# Patient Record
Sex: Female | Born: 1965 | Race: Black or African American | Hispanic: No | Marital: Married | Smoking: Current every day smoker
Health system: Southern US, Community
[De-identification: ages and names within clinical notes are randomized; demographics above are authoritative.]

## PROBLEM LIST (undated history)

## (undated) DIAGNOSIS — Z21 Asymptomatic human immunodeficiency virus [HIV] infection status: Secondary | ICD-10-CM

## (undated) DIAGNOSIS — B20 Human immunodeficiency virus [HIV] disease: Secondary | ICD-10-CM

## (undated) DIAGNOSIS — IMO0002 Reserved for concepts with insufficient information to code with codable children: Secondary | ICD-10-CM

## (undated) HISTORY — DX: Human immunodeficiency virus (HIV) disease: B20

## (undated) HISTORY — DX: Reserved for concepts with insufficient information to code with codable children: IMO0002

## (undated) HISTORY — DX: Asymptomatic human immunodeficiency virus (hiv) infection status: Z21

---

## 2005-08-07 ENCOUNTER — Ambulatory Visit: Payer: Self-pay | Admitting: Internal Medicine

## 2005-08-08 ENCOUNTER — Ambulatory Visit: Payer: Self-pay | Admitting: *Deleted

## 2005-08-14 ENCOUNTER — Ambulatory Visit (HOSPITAL_COMMUNITY): Admission: RE | Admit: 2005-08-14 | Discharge: 2005-08-14 | Payer: Self-pay | Admitting: Family Medicine

## 2006-01-22 ENCOUNTER — Ambulatory Visit: Payer: Self-pay | Admitting: Internal Medicine

## 2006-09-28 DIAGNOSIS — A539 Syphilis, unspecified: Secondary | ICD-10-CM

## 2006-09-28 DIAGNOSIS — Z8619 Personal history of other infectious and parasitic diseases: Secondary | ICD-10-CM

## 2006-09-28 DIAGNOSIS — B1710 Acute hepatitis C without hepatic coma: Secondary | ICD-10-CM

## 2006-09-28 DIAGNOSIS — F1010 Alcohol abuse, uncomplicated: Secondary | ICD-10-CM

## 2006-09-28 DIAGNOSIS — B20 Human immunodeficiency virus [HIV] disease: Secondary | ICD-10-CM | POA: Insufficient documentation

## 2006-10-15 ENCOUNTER — Ambulatory Visit: Payer: Self-pay | Admitting: Internal Medicine

## 2006-10-15 LAB — CONVERTED CEMR LAB
ALT: 9 units/L (ref 0–35)
Albumin: 3.9 g/dL (ref 3.5–5.2)
Alkaline Phosphatase: 74 units/L (ref 39–117)
BUN: 13 mg/dL (ref 6–23)
Basophils Relative: 1 % (ref 0–1)
CO2: 23 meq/L (ref 19–32)
Creatinine, Ser: 0.91 mg/dL (ref 0.40–1.20)
Glucose, Bld: 103 mg/dL — ABNORMAL HIGH (ref 70–99)
HCT: 35.6 % — ABNORMAL LOW (ref 36.0–46.0)
HIV 1 RNA Quant: 57900 copies/mL — ABNORMAL HIGH (ref <50)
Hemoglobin: 11.7 g/dL — ABNORMAL LOW (ref 12.0–15.0)
Lymphs Abs: 1.6 10*3/uL (ref 0.7–3.3)
MCHC: 32.9 g/dL (ref 30.0–36.0)
Monocytes Absolute: 0.4 10*3/uL (ref 0.2–0.7)
Neutro Abs: 2.1 10*3/uL (ref 1.7–7.7)
Neutrophils Relative %: 50 % (ref 43–77)
RBC: 4 M/uL (ref 3.87–5.11)

## 2006-10-29 ENCOUNTER — Ambulatory Visit: Payer: Self-pay | Admitting: Internal Medicine

## 2006-12-17 ENCOUNTER — Ambulatory Visit: Payer: Self-pay | Admitting: Internal Medicine

## 2008-06-12 ENCOUNTER — Encounter: Payer: Self-pay | Admitting: Internal Medicine

## 2008-06-12 ENCOUNTER — Ambulatory Visit: Payer: Self-pay | Admitting: Internal Medicine

## 2008-06-13 ENCOUNTER — Encounter: Payer: Self-pay | Admitting: Internal Medicine

## 2008-06-13 LAB — CONVERTED CEMR LAB
AST: 32 units/L (ref 0–37)
Alkaline Phosphatase: 80 units/L (ref 39–117)
Basophils Absolute: 0 10*3/uL (ref 0.0–0.1)
Basophils Relative: 1 % (ref 0–1)
CD4 T Helper %: 15 % — ABNORMAL LOW (ref 32–62)
Calcium: 9.5 mg/dL (ref 8.4–10.5)
Chlamydia, Swab/Urine, PCR: NEGATIVE
Creatinine, Ser: 0.79 mg/dL (ref 0.40–1.20)
Eosinophils Absolute: 0.1 10*3/uL (ref 0.0–0.7)
Eosinophils Relative: 2 % (ref 0–5)
HCT: 36.1 % (ref 36.0–46.0)
Hemoglobin: 11.7 g/dL — ABNORMAL LOW (ref 12.0–15.0)
Lymphs Abs: 1.2 10*3/uL (ref 0.7–4.0)
MCHC: 32.4 g/dL (ref 30.0–36.0)
Monocytes Absolute: 0.7 10*3/uL (ref 0.1–1.0)
Monocytes Relative: 17 % — ABNORMAL HIGH (ref 3–12)
Neutro Abs: 2.1 10*3/uL (ref 1.7–7.7)
Neutrophils Relative %: 52 % (ref 43–77)
Platelets: 167 10*3/uL (ref 150–400)
RBC: 4.06 M/uL (ref 3.87–5.11)
Total Bilirubin: 0.6 mg/dL (ref 0.3–1.2)
Total Protein: 9 g/dL — ABNORMAL HIGH (ref 6.0–8.3)
Total lymphocyte count: 1200 cells/mcL (ref 700–3300)
WBC: 4 10*3/uL (ref 4.0–10.5)

## 2008-06-15 ENCOUNTER — Encounter: Payer: Self-pay | Admitting: Internal Medicine

## 2008-06-19 ENCOUNTER — Ambulatory Visit (HOSPITAL_COMMUNITY): Admission: RE | Admit: 2008-06-19 | Discharge: 2008-06-19 | Payer: Self-pay | Admitting: Internal Medicine

## 2008-06-26 ENCOUNTER — Encounter: Payer: Self-pay | Admitting: Internal Medicine

## 2008-06-26 ENCOUNTER — Ambulatory Visit: Payer: Self-pay | Admitting: Internal Medicine

## 2008-08-24 ENCOUNTER — Encounter: Payer: Self-pay | Admitting: Internal Medicine

## 2008-08-24 ENCOUNTER — Ambulatory Visit: Payer: Self-pay | Admitting: Internal Medicine

## 2008-08-24 LAB — CONVERTED CEMR LAB
ALT: <8 units/L (ref 0–35)
BUN: 14 mg/dL (ref 6–23)
Basophils Absolute: 0 10*3/uL (ref 0.0–0.1)
Basophils Relative: 1 % (ref 0–1)
CO2: 23 meq/L (ref 19–32)
Calcium: 9.4 mg/dL (ref 8.4–10.5)
Creatinine, Ser: 0.89 mg/dL (ref 0.40–1.20)
Glucose, Bld: 89 mg/dL (ref 70–99)
HCT: 35 % — ABNORMAL LOW (ref 36.0–46.0)
HIV 1 RNA Quant: 3880 copies/mL — ABNORMAL HIGH (ref <48)
Lymphocytes Relative: 50 % — ABNORMAL HIGH (ref 12–46)
MCV: 88.6 fL (ref 78.0–100.0)
Potassium: 4.3 meq/L (ref 3.5–5.3)
Total Bilirubin: 0.4 mg/dL (ref 0.3–1.2)
Total lymphocyte count: 2300 cells/mcL (ref 700–3300)
WBC: 4.6 10*3/uL (ref 4.0–10.5)

## 2009-09-25 ENCOUNTER — Encounter: Payer: Self-pay | Admitting: Infectious Disease

## 2009-09-27 ENCOUNTER — Encounter: Payer: Self-pay | Admitting: Infectious Disease

## 2009-09-27 ENCOUNTER — Ambulatory Visit: Payer: Self-pay | Admitting: Internal Medicine

## 2009-09-27 LAB — CONVERTED CEMR LAB
ALT: 13 units/L (ref 0–35)
Albumin: 4.1 g/dL (ref 3.5–5.2)
Alkaline Phosphatase: 68 units/L (ref 39–117)
BUN: 9 mg/dL (ref 6–23)
CO2: 22 meq/L (ref 19–32)
Calcium: 8.8 mg/dL (ref 8.4–10.5)
Chloride: 107 meq/L (ref 96–112)
Creatinine, Ser: 0.72 mg/dL (ref 0.40–1.20)
Glucose, Bld: 81 mg/dL (ref 70–99)
HCT: 36.5 % (ref 36.0–46.0)
Lymphs Abs: 1.5 10*3/uL (ref 0.7–4.0)
MCHC: 32.6 g/dL (ref 30.0–36.0)
Monocytes Absolute: 0.4 10*3/uL (ref 0.1–1.0)
Neutro Abs: 1.7 10*3/uL (ref 1.7–7.7)
RDW: 13.9 % (ref 11.5–15.5)
Sodium: 139 meq/L (ref 135–145)
Total Protein: 8.1 g/dL (ref 6.0–8.3)
WBC: 3.7 10*3/uL — ABNORMAL LOW (ref 4.0–10.5)

## 2009-11-09 ENCOUNTER — Encounter (INDEPENDENT_AMBULATORY_CARE_PROVIDER_SITE_OTHER): Payer: Self-pay | Admitting: *Deleted

## 2009-11-15 ENCOUNTER — Encounter (INDEPENDENT_AMBULATORY_CARE_PROVIDER_SITE_OTHER): Payer: Self-pay | Admitting: *Deleted

## 2009-11-21 ENCOUNTER — Ambulatory Visit: Payer: Self-pay | Admitting: Internal Medicine

## 2009-11-21 ENCOUNTER — Encounter (INDEPENDENT_AMBULATORY_CARE_PROVIDER_SITE_OTHER): Payer: Self-pay | Admitting: *Deleted

## 2009-11-21 LAB — CONVERTED CEMR LAB: HIV-1 RNA Quant, Log: 5.31 — ABNORMAL HIGH (ref <1.30)

## 2009-11-29 ENCOUNTER — Ambulatory Visit: Payer: Self-pay | Admitting: Internal Medicine

## 2009-11-30 ENCOUNTER — Encounter (INDEPENDENT_AMBULATORY_CARE_PROVIDER_SITE_OTHER): Payer: Self-pay | Admitting: *Deleted

## 2009-12-31 ENCOUNTER — Encounter (INDEPENDENT_AMBULATORY_CARE_PROVIDER_SITE_OTHER): Payer: Self-pay | Admitting: *Deleted

## 2010-01-14 ENCOUNTER — Telehealth (INDEPENDENT_AMBULATORY_CARE_PROVIDER_SITE_OTHER): Payer: Self-pay | Admitting: *Deleted

## 2010-02-05 ENCOUNTER — Encounter (INDEPENDENT_AMBULATORY_CARE_PROVIDER_SITE_OTHER): Payer: Self-pay | Admitting: *Deleted

## 2010-02-08 ENCOUNTER — Encounter: Payer: Self-pay | Admitting: Internal Medicine

## 2010-02-26 ENCOUNTER — Telehealth: Payer: Self-pay | Admitting: *Deleted

## 2010-03-06 NOTE — Miscellaneous (Signed)
Summary: Bridge Counselor  Clinical Lists Changes BC transported pt to [facility] today for her scheduled appointment with Dr. Philipp Deputy. Pt was put on a new regimen of Truvada and Isentress. BC will assist pt with applying for ADAP to obtain her medications. Once pt receives her medications, BC will provide treatment adherence and coordinate scheduling pt's follow up appointments for labs and MD visit.

## 2010-03-06 NOTE — Miscellaneous (Signed)
Summary: RW Financial Update  Clinical Lists Changes  Observations: Added new observation of AIDSDAP: Yes 2011 (12/31/2009 10:30) Added new observation of PCTFPL: 18.08  (12/31/2009 10:30) Added new observation of INCOMESOURCE: wages  (12/31/2009 10:30) Added new observation of HOUSEINCOME: 1960  (12/31/2009 10:30) Added new observation of HOUSING: Stable/permanent  (12/31/2009 10:30) Added new observation of FINASSESSDT: 12/04/2009  (12/31/2009 10:30) Added new observation of YEARLYEXPEN: 0  (12/31/2009 10:30)

## 2010-03-06 NOTE — Miscellaneous (Signed)
Summary: Orders Update  Clinical Lists Changes  Orders: Added new Test order of T-Comprehensive Metabolic Panel [phone]) - Signed Added new Test order of T-CBC w/Diff [phone]) - Signed Added new Test order of T-CD4 [phone]) - Signed Added new Test order of T-HIV Viral Load [phone]) - Signed Added new Test order of T-Syphilis Test ([facility]) ([id number]) - Signed

## 2010-03-06 NOTE — Miscellaneous (Signed)
Summary: Bridge Counselor  Clinical Lists Changes BC transported pt to clinic today for lab work. Pt will return to see Dr. Philipp Deputy on the 27th if her lab results are in. [facility] will coordinate.

## 2010-03-06 NOTE — Assessment & Plan Note (Signed)
Summary: F/U [MKJ]   CC:  follow-up visit, lab results, and pt. c/o cough x 2 weeks.  History of Present Illness: Pt has been off Atripla for several months. She states that itmade her nauseated. She wouldlike to get back on antiretroviral therapy but with a different regimen.  Pt is currently working part time. She has been feeling well.  Preventive Screening-Counseling & Management  Alcohol-Tobacco     Alcohol drinks/day: 1     Smoking Status: current     Packs/Day: <0.25  Caffeine-Diet-Exercise     Caffeine use/day: 0     Does Patient Exercise: yes     Type of exercise: walks     Exercise (avg: min/session): 30-60     Times/week: 7  Hep-HIV-STD-Contraception     HIV Risk: risk noted  Safety-Violence-Falls     Seat Belt Use: yes      Sexual History:  none.        Drug Use:  never.        Blood Transfusions:  no.    Comments: pt. declined condoms   Updated Prior Medication List: TRUVADA 200-300 MG TABS (EMTRICITABINE-TENOFOVIR) Take 1 tablet by mouth once a day ISENTRESS 400 MG TABS (RALTEGRAVIR POTASSIUM) Take 1 tablet by mouth two times a day  Current Allergies (reviewed today): No known allergies  Past History:  Past Medical History: Last updated: 09/28/2006 HIV disease Hepatitis B, hx of Hepatitis C  Social History: Sexual History:  none Drug Use:  never Blood Transfusions:  no  Review of Systems  The patient denies anorexia, fever, and weight loss.    Vital Signs:  Patient profile:   45 year old female Menstrual status:  postmenopausal Height:      63.5 inches (161.29 cm) Weight:      125.8 pounds (57.18 kg) BMI:     22.01 Temp:     98.6 degrees F (37.00 degrees C) oral Pulse rate:   94 / minute BP sitting:   133 / 88  (left arm)  Vitals Entered By: Wendall Mola CMA Duncan Dull) (November 29, 2009 3:06 PM) CC: follow-up visit, lab results, pt. c/o cough x 2 weeks Is Patient Diabetic? No Pain Assessment Patient in pain? no       Nutritional Status BMI of 19 -24 = normal Nutritional Status Detail appetite "up and down"  Have you ever been in a relationship where you felt threatened, hurt or afraid?No   Does patient need assistance? Functional Status Self care Ambulation Normal Comments pt. has been off meds for two months, c/o Atripla causing nausea, dizziness, and sweating   Physical Exam  General:  alert, well-developed, well-nourished, and well-hydrated.   Head:  normocephalic and atraumatic.   Mouth:  pharynx pink and moist.  no thrush  Lungs:  normal breath sounds.     Impression & Recommendations:  Problem # 1:  HIV DISEASE (ICD-042)  Will makesure she is enrolled in ADAP and start Truvada and Isentress. Potential side effects were discussed. Once she has been taking her HIV meds for 6 weeks she willretun for labs and see me 2 weeks later.  Influenza vaccine given. Diagnostics Reviewed:  CD4: 320 (11/22/2009)   WBC: 3.7 (09/27/2009)   Hgb: 11.9 (09/27/2009)   HCT: 36.5 (09/27/2009)   Platelets: 153 (09/27/2009) HIV-1 RNA: 205000 (11/21/2009)     Future Orders: T-Hepatitis B Surface Antigen ([id number]) ... 01/10/2010 T-Hepatitis B Surface Antibody [phone]) ... 01/10/2010 T-Hepatitis C Viral Load [phone]) ... 01/10/2010  Medications Added  to Medication List This Visit: 1)  Truvada 200-300 Mg Tabs (Emtricitabine-tenofovir) .... Take 1 tablet by mouth once a day 2)  Isentress 400 Mg Tabs (Raltegravir potassium) .... Take 1 tablet by mouth two times a day  Other Orders: Est. Patient Level III (52841) Influenza Vaccine NON MCR (32440) TB Skin Test (10272) Admin 1st Vaccine (53664) Future Orders: T-CD4SP ([facility]) (CD4SP) ... 01/10/2010 T-HIV Viral Load [phone]) ... 01/10/2010 T-Comprehensive Metabolic Panel [phone]) ... 01/10/2010 T-CBC w/Diff ([id number]) ... 01/10/2010 T-Lipid Profile [phone]) ... 01/10/2010  Patient Instructions: 1)  follow-up 6 weeks  after starting meds for labs  Prescriptions: ISENTRESS 400 MG TABS (RALTEGRAVIR POTASSIUM) Take 1 tablet by mouth two times a day  #60 x 5   Entered and Authorized by:   Yisroel Ramming MD   Signed by:   Yisroel Ramming MD on 11/29/2009   Method used:   Print then Give to Patient   RxID:   [id number] TRUVADA 200-300 MG TABS (EMTRICITABINE-TENOFOVIR) Take 1 tablet by mouth once a day  #30 x 5   Entered and Authorized by:   Yisroel Ramming MD   Signed by:   Yisroel Ramming MD on 11/29/2009   Method used:   Print then Give to Patient   RxID:   [id number]      Immunizations Administered:  Influenza Vaccine # 1:    Vaccine Type: Fluvax Non-MCR    Site: left deltoid    Mfr: Novartis    Dose: 0.5 ml    Route: IM    Given by: Wendall Mola CMA ( AAMA)    Exp. Date: 05/05/2010    Lot #: [id number]    VIS given: 08/28/09 version given November 29, 2009.  PPD Skin Test:    Vaccine Type: PPD    Site: left forearm    Mfr: Sanofi Pasteur    Dose: 0.1 ml    Route: ID    Given by: Wendall Mola CMA ( AAMA)    Exp. Date: 12/06/2010    Lot #: [id number]  Flu Vaccine Consent Questions:    Do you have a history of severe allergic reactions to this vaccine? no    Any prior history of allergic reactions to egg and/or gelatin? no    Do you have a sensitivity to the preservative Thimersol? no    Do you have a past history of Guillan-Barre Syndrome? no    Do you currently have an acute febrile illness? no    Have you ever had a severe reaction to latex? no    Vaccine information given and explained to patient? yes    Are you currently pregnant? no   Appended Document: F/U [MKJ]    Clinical Lists Changes  Observations: Added new observation of TB PPDRESULT: negative (12/03/2009 12:30) Added new observation of PPD RESULT: < 5mm (12/03/2009 12:30) Added new observation of TB-PPD RDDTE: 12/03/2009 (12/03/2009 12:30)       PPD Results    Date of reading: 12/03/2009     Results: < 5mm    Interpretation: negative

## 2010-03-06 NOTE — Miscellaneous (Signed)
Summary: Bridge Counselor  Clinical Lists Changes BC enrolled pt into the bridge counseling program today. Pt has admitted that she is not taking her Atripla due to the side effects. Pt would like to discuss alternate Tx options with Dr. Philipp Deputy. BC will coordinate a lab appointment as well as a MD visit and provide transportation.

## 2010-03-06 NOTE — Miscellaneous (Signed)
Summary: HIPAA Restrictions  HIPAA Restrictions   Imported By: Florinda Marker 09/27/2009 14:59:51  _____________________________________________________________________  External Attachment:    Type:   Image     Comment:   External Document

## 2010-03-06 NOTE — Miscellaneous (Signed)
Summary: Orders Update  Clinical Lists Changes  Orders: Added new Test order of T-CD4SP [facility]) (CD4SP) - Signed Added new Test order of T-HIV Viral Load [phone]) - Signed     Process Orders Check Orders Results:     [facility]: ABN not required for this insurance Tests Sent for requisitioning (November 21, 2009 2:31 PM):     11/21/2009: [facility] -- T-HIV Viral Load [phone] (signed)

## 2010-03-06 NOTE — Miscellaneous (Signed)
Summary: Bridge Counselor  Clinical Lists Changes Pt called BC to let BC know that she got a job and is starting today. Pt will not make it in for her lab appointment today but will call [facility] when she gets off to reschedule. BC will try to get pt in tomorrow so that she can keep her scheduled appointment with Dr. Philipp Deputy.

## 2010-03-07 NOTE — Miscellaneous (Signed)
Summary: RW Update  Clinical Lists Changes  Observations: Added new observation of RWPARTICIP: Yes (02/08/2010 10:26)

## 2010-03-07 NOTE — Progress Notes (Signed)
Summary: PAP rx arrived, pt. informed  Phone Note Outgoing Call   Call placed by: Jennet Maduro RN,  January 14, 2010 2:03 PM Call placed to: Patient Action Taken: Assistance medications ready for pick up Summary of Call: ADAP rxes arrived.  Truvada & Isentress arrived.  Message left for pt. Jennet Maduro RN  January 14, 2010 2:04 PM       Appended Document: PAP rx arrived, pt. informed pt. picked up ADAP meds

## 2010-03-07 NOTE — Progress Notes (Signed)
  Phone Note Outgoing Call   Call placed by: Golden Circle RN,  February 26, 2010 1:38 PM Call placed to: pt Action Taken: Assistance medications ready for pick up    Prescriptions: ISENTRESS 400 MG TABS (RALTEGRAVIR POTASSIUM) Take 1 tablet by mouth two times a day  #60 x 5   Entered by:   Golden Circle RN   Authorized by:   Johny Sax MD   Signed by:   Golden Circle RN on 02/26/2010   Method used:   Samples Given   RxID:   [id number] TRUVADA 200-300 MG TABS (EMTRICITABINE-TENOFOVIR) Take 1 tablet by mouth once a day  #30 x 5   Entered by:   Golden Circle RN   Authorized by:   Johny Sax MD   Signed by:   Golden Circle RN on 02/26/2010   Method used:   Samples Given   RxID:   [id number]  LM for pt that meds are here.Golden Circle RN  February 26, 2010 1:42 PM

## 2010-03-07 NOTE — Miscellaneous (Signed)
Summary: Bridge Counselor  Clinical Lists Changes BC closed pt's file today. Pt is enrolled with [facility] for ongoing case management. 

## 2010-03-15 ENCOUNTER — Other Ambulatory Visit: Payer: Self-pay

## 2010-03-27 ENCOUNTER — Encounter (INDEPENDENT_AMBULATORY_CARE_PROVIDER_SITE_OTHER): Payer: Self-pay | Admitting: *Deleted

## 2010-03-29 ENCOUNTER — Telehealth (INDEPENDENT_AMBULATORY_CARE_PROVIDER_SITE_OTHER): Payer: Self-pay | Admitting: *Deleted

## 2010-03-29 ENCOUNTER — Ambulatory Visit: Payer: Self-pay | Admitting: Adult Health

## 2010-04-02 NOTE — Miscellaneous (Signed)
  Clinical Lists Changes  Observations: Added new observation of HIV RISK BEH: Heterosexual contact (03/27/2010 17:10)

## 2010-04-02 NOTE — Progress Notes (Signed)
  Phone Note Outgoing Call   Call placed by: Alesia Morin CMA,  March 29, 2010 12:44 PM Action Taken: Phone Call Completed Summary of Call: Called patient to inform her that her medications were her and the phone number on her acct is not active at this time. I was not allowed to leave a message. She also had an appt today that she NS. Will try her back on Monday to see if the phone is on. Alesia Morin CMA  March 29, 2010 12:46 PM  It is her Truvada and Isentress

## 2010-04-17 LAB — T-HELPER CELL (CD4) - (RCID CLINIC ONLY): CD4 % Helper T Cell: 22 % — ABNORMAL LOW (ref 33–55)

## 2010-04-19 ENCOUNTER — Telehealth: Payer: Self-pay | Admitting: *Deleted

## 2010-04-19 LAB — T-HELPER CELL (CD4) - (RCID CLINIC ONLY): CD4 % Helper T Cell: 23 % — ABNORMAL LOW (ref 33–55)

## 2010-04-23 NOTE — Progress Notes (Signed)
Summary: ADAP meds arrived, pt. notified  Phone Note Outgoing Call      Prescriptions: ISENTRESS 400 MG TABS (RALTEGRAVIR POTASSIUM) Take 1 tablet by mouth two times a day  #60 x 0   Entered by:   Wendall Mola CMA ( AAMA)   Authorized by:   Clinic Refill ID   Signed by:   Wendall Mola CMA ( AAMA) on 04/19/2010   Method used:   Samples Given   RxID:   [id number] TRUVADA 200-300 MG TABS (EMTRICITABINE-TENOFOVIR) Take 1 tablet by mouth once a day  #30 x 0   Entered by:   Wendall Mola CMA ( AAMA)   Authorized by:   Clinic Refill ID   Signed by:   Wendall Mola CMA ( AAMA) on 04/19/2010   Method used:   Samples Given   RxID:   [id number]

## 2010-06-12 ENCOUNTER — Other Ambulatory Visit (INDEPENDENT_AMBULATORY_CARE_PROVIDER_SITE_OTHER): Payer: Self-pay

## 2010-06-12 DIAGNOSIS — B20 Human immunodeficiency virus [HIV] disease: Secondary | ICD-10-CM

## 2010-06-12 LAB — LIPID PANEL
Cholesterol: 159 mg/dL (ref 0–200)
Total CHOL/HDL Ratio: 3.2 Ratio
Triglycerides: 105 mg/dL (ref <150)
VLDL: 21 mg/dL (ref 0–40)

## 2010-06-12 LAB — CBC WITH DIFFERENTIAL/PLATELET
Basophils Relative: 0 % (ref 0–1)
Eosinophils Relative: 5 % (ref 0–5)
MCV: 89.3 fL (ref 78.0–100.0)
WBC: 4.5 10*3/uL (ref 4.0–10.5)

## 2010-06-13 LAB — HEPATITIS B SURFACE ANTIBODY,QUALITATIVE

## 2010-06-13 LAB — COMPLETE METABOLIC PANEL WITH GFR
ALT: <8 U/L (ref 0–35)
BUN: 17 mg/dL (ref 6–23)
CO2: 24 mEq/L (ref 19–32)
Chloride: 108 mEq/L (ref 96–112)
GFR, Est African American: >60 mL/min (ref >60)
GFR, Est Non African American: >60 mL/min (ref >60)
Glucose, Bld: 83 mg/dL (ref 70–99)
Potassium: 5.6 mEq/L — ABNORMAL HIGH (ref 3.5–5.3)
Total Bilirubin: 0.5 mg/dL (ref 0.3–1.2)

## 2010-06-13 LAB — HEPATITIS B SURFACE ANTIGEN: Hepatitis B Surface Ag: NEGATIVE

## 2010-06-13 LAB — HEPATITIS C RNA QUANTITATIVE

## 2010-06-18 ENCOUNTER — Other Ambulatory Visit: Payer: Self-pay | Admitting: Infectious Diseases

## 2010-06-18 DIAGNOSIS — E875 Hyperkalemia: Secondary | ICD-10-CM

## 2010-06-19 ENCOUNTER — Other Ambulatory Visit: Payer: Self-pay

## 2010-06-21 ENCOUNTER — Other Ambulatory Visit: Payer: Self-pay

## 2010-06-25 ENCOUNTER — Ambulatory Visit (INDEPENDENT_AMBULATORY_CARE_PROVIDER_SITE_OTHER): Payer: Self-pay | Admitting: Adult Health

## 2010-06-25 ENCOUNTER — Encounter: Payer: Self-pay | Admitting: Adult Health

## 2010-06-25 VITALS — BP 150/92 | HR 81 | Temp 98.2°F | Ht 63.5 in | Wt 124.1 lb

## 2010-06-25 DIAGNOSIS — Z21 Asymptomatic human immunodeficiency virus [HIV] infection status: Secondary | ICD-10-CM

## 2010-06-25 DIAGNOSIS — Z23 Encounter for immunization: Secondary | ICD-10-CM

## 2010-06-25 DIAGNOSIS — B20 Human immunodeficiency virus [HIV] disease: Secondary | ICD-10-CM

## 2010-06-25 DIAGNOSIS — Z79899 Other long term (current) drug therapy: Secondary | ICD-10-CM

## 2010-06-25 DIAGNOSIS — Z113 Encounter for screening for infections with a predominantly sexual mode of transmission: Secondary | ICD-10-CM

## 2010-06-25 NOTE — Progress Notes (Signed)
Addended by: Golden Circle on: 06/25/2010 02:14 PM   Modules accepted: Orders

## 2010-06-25 NOTE — Progress Notes (Signed)
  Subjective:    Patient ID: Christine Dawson, female    DOB: 06-22-1965, 45 y.o.   MRN: [id number]  HPI Presents to clinic for followup. Not seen since October 2011. Remains adherent to her antiretroviral regimens, which include Isentress, and Truvada. Denies any physical complaints today. States has been feeling well. Relates has not had a Pap smear in "years." Last mammogram was 1 year ago.  Review of Systems  Constitutional: Negative.   HENT: Negative.   Eyes: Negative.   Respiratory: Negative.   Cardiovascular: Negative.   Gastrointestinal: Negative.   Genitourinary: Negative.   Musculoskeletal: Negative.   Skin: Negative.   Neurological: Negative.   Hematological: Negative.   Psychiatric/Behavioral: Negative.        Objective:   Physical Exam  Constitutional: She is oriented to person, place, and time. She appears well-developed and well-nourished. No distress.  HENT:  Head: Normocephalic and atraumatic.  Right Ear: External ear normal.  Left Ear: External ear normal.  Nose: Nose normal.  Mouth/Throat: Oropharynx is clear and moist.  Eyes: Conjunctivae and EOM are normal. Pupils are equal, round, and reactive to light.  Neck: Normal range of motion. Neck supple.  Cardiovascular: Normal rate, regular rhythm, normal heart sounds and intact distal pulses.   Pulmonary/Chest: Effort normal and breath sounds normal.  Abdominal: Soft. Bowel sounds are normal.  Musculoskeletal: Normal range of motion.  Neurological: She is alert and oriented to person, place, and time. No cranial nerve deficit. She exhibits normal muscle tone. Coordination normal.  Skin: Skin is warm and dry.  Psychiatric: She has a normal mood and affect. Her behavior is normal. Judgment and thought content normal.          Assessment & Plan:  1. HIV. Labs obtained 06/12/2010 show a CD4 count of 410 at 23% with a viral load of 174 copies per mL. CD4 percent, are identical although absolute numbers are  markedly better. Although her viral load is down by 3 logs is not yet completely undetectable. We will continue present management. For now, have her return in 10 weeks for repeat labs and followup in 3 months.  2. High K+. Value of 5.6.  Other indices within. Chemistry panel do not coincide with the differences and the potassium. We'll repeat CMP today.  3. Elevated Blood Pressure. Review of her records indicate. No previous documentation of elevations in blood pressure. We will ask her to check her blood pressure, at least once per week. Document, and reevaluate this on her return.  4. Routine Health Maintenance. Schedule Pap smear with Mrs. Lennox Laity. Agreed to schedule her annual mammogram at the [facility].  5. Indeterminate HBV Surface Antibody. Will give hep B booster today and repeat serologies in 6 months.  Emphasized during the visit today. For more, regular followups until some of the lab values more stable for the time being. She verbally acknowledged all information provided to her and agreed with plan of care.

## 2010-06-26 LAB — COMPLETE METABOLIC PANEL WITH GFR
AST: 26 U/L (ref 0–37)
CO2: 24 mEq/L (ref 19–32)
Calcium: 9.8 mg/dL (ref 8.4–10.5)
Chloride: 104 mEq/L (ref 96–112)
Creat: 0.86 mg/dL (ref 0.40–1.20)
Glucose, Bld: 86 mg/dL (ref 70–99)
Potassium: 5.7 mEq/L — ABNORMAL HIGH (ref 3.5–5.3)
Total Bilirubin: 0.7 mg/dL (ref 0.3–1.2)
Total Protein: 8.5 g/dL — ABNORMAL HIGH (ref 6.0–8.3)

## 2010-06-28 ENCOUNTER — Ambulatory Visit: Payer: Self-pay

## 2010-07-05 ENCOUNTER — Ambulatory Visit (INDEPENDENT_AMBULATORY_CARE_PROVIDER_SITE_OTHER): Payer: Self-pay | Admitting: *Deleted

## 2010-07-05 ENCOUNTER — Other Ambulatory Visit (HOSPITAL_COMMUNITY)
Admission: RE | Admit: 2010-07-05 | Discharge: 2010-07-05 | Disposition: A | Discharge status: Home / Self Care | Payer: Self-pay | Source: Ambulatory Visit | Attending: Internal Medicine | Admitting: Internal Medicine

## 2010-07-05 DIAGNOSIS — IMO0002 Reserved for concepts with insufficient information to code with codable children: Secondary | ICD-10-CM

## 2010-07-05 DIAGNOSIS — Z124 Encounter for screening for malignant neoplasm of cervix: Secondary | ICD-10-CM

## 2010-07-05 DIAGNOSIS — R87612 Low grade squamous intraepithelial lesion on cytologic smear of cervix (LGSIL): Secondary | ICD-10-CM

## 2010-07-05 DIAGNOSIS — Z01419 Encounter for gynecological examination (general) (routine) without abnormal findings: Secondary | ICD-10-CM | POA: Insufficient documentation

## 2010-07-05 NOTE — Patient Instructions (Addendum)
Your results will be ready in about a week.  They will be mailed to you.  Jennet Maduro, RN  Pt's results came back abnormal.  Sending referral to [facility] for appt.  Jennet Maduro, RN

## 2010-07-15 ENCOUNTER — Ambulatory Visit: Payer: Self-pay

## 2010-07-16 ENCOUNTER — Ambulatory Visit: Payer: Self-pay

## 2010-08-04 DIAGNOSIS — R87619 Unspecified abnormal cytological findings in specimens from cervix uteri: Secondary | ICD-10-CM

## 2010-08-04 DIAGNOSIS — IMO0002 Reserved for concepts with insufficient information to code with codable children: Secondary | ICD-10-CM

## 2010-08-04 HISTORY — DX: Unspecified abnormal cytological findings in specimens from cervix uteri: R87.619

## 2010-08-04 HISTORY — DX: Reserved for concepts with insufficient information to code with codable children: IMO0002

## 2010-09-24 ENCOUNTER — Other Ambulatory Visit: Payer: Self-pay | Admitting: *Deleted

## 2010-09-24 ENCOUNTER — Other Ambulatory Visit: Payer: Self-pay | Admitting: Licensed Clinical Social Worker

## 2010-09-24 DIAGNOSIS — B20 Human immunodeficiency virus [HIV] disease: Secondary | ICD-10-CM

## 2010-09-24 MED ORDER — RALTEGRAVIR POTASSIUM 400 MG PO TABS: 400.0000 mg | ORAL_TABLET | Freq: Two times a day (BID) | ORAL | Status: DC

## 2010-09-24 MED ORDER — EMTRICITABINE-TENOFOVIR DF 200-300 MG PO TABS: 1.0000 | ORAL_TABLET | Freq: Every day | ORAL | Status: DC

## 2010-10-14 ENCOUNTER — Other Ambulatory Visit (HOSPITAL_COMMUNITY)
Admission: RE | Admit: 2010-10-14 | Discharge: 2010-10-14 | Disposition: A | Discharge status: Home / Self Care | Payer: Self-pay | Source: Ambulatory Visit | Attending: Family Medicine | Admitting: Family Medicine

## 2010-10-14 ENCOUNTER — Encounter: Payer: Self-pay | Admitting: Family Medicine

## 2010-10-14 ENCOUNTER — Ambulatory Visit (INDEPENDENT_AMBULATORY_CARE_PROVIDER_SITE_OTHER): Payer: Self-pay | Admitting: Family Medicine

## 2010-10-14 VITALS — BP 155/89 | HR 68 | Temp 97.3°F | Ht 62.0 in | Wt 131.1 lb

## 2010-10-14 DIAGNOSIS — IMO0002 Reserved for concepts with insufficient information to code with codable children: Secondary | ICD-10-CM

## 2010-10-14 DIAGNOSIS — R87612 Low grade squamous intraepithelial lesion on cytologic smear of cervix (LGSIL): Secondary | ICD-10-CM

## 2010-10-14 DIAGNOSIS — N870 Mild cervical dysplasia: Secondary | ICD-10-CM | POA: Insufficient documentation

## 2010-10-14 MED ORDER — IBUPROFEN 200 MG PO TABS: 800.0000 mg | ORAL_TABLET | Freq: Once | ORAL | Status: DC

## 2010-10-14 MED ORDER — IBUPROFEN 800 MG PO TABS: 800.0000 mg | ORAL_TABLET | Freq: Three times a day (TID) | ORAL | Status: AC

## 2010-10-14 NOTE — Patient Instructions (Signed)
Colposcopy Colposcopy is a procedure that uses a special lighted microscope (colposcope). It examines your cervix and vagina, or the area around the outside of the vagina, for signs of disease or abnormalities in the cells. You may be sent to a specialist (gynecologist) to do the colposcopy. A biopsy (tissue sample) may be collected during a colposcopy, if the caregiver finds any unusual cells. The biopsy is sent to the lab for further testing, and the results are reported back to your caregiver.  A WOMAN MAY NEED THIS PROCEDURE IF:  She has had an abnormal pap smear (taking cells from the cervix for testing).   She has a sore on her cervix, and a Pap test was normal.   The Pap test suggests human papilloma virus (HPV). This virus can cause genital warts and is linked to the development of cervical cancer.   She has genital warts on the cervix, or in or around the outside of the vagina.   Her mother took the drug DES while pregnant.   She has painful intercourse.   She has vaginal bleeding, especially after sexual intercourse.   There is a need to evaluate the results of previous treatment.  BEFORE THE PROCEDURE  Colposcopy is done when you are not having a menstrual period.   For 24 hours before the colposcopy, do not:   Douche.   Use tampons.   Use medicines, creams, or suppositories in the vagina.   Have sexual intercourse.  PROCEDURE  A colposcopy is done while a woman is lying on her back with her feet in foot rests (stirrups).   A speculum is placed inside the vagina to keep it open and to allow the caregiver to see the cervix. This is the same instrument used to do a pap smear.   The colposcope is placed outside the vagina. It is used to magnify and examine the cervix, vagina, and the area around the outside of the vagina.   A small amount of liquid solution is placed on the area that is to be viewed. This solution is placed on with a cotton applicator. This solution  makes it easier to see the abnormal cells.   Your caregiver will suck out mucus and cells from the canal of the cervix.   Small pieces of tissue for biopsy may be taken at the same time. You may feel mild pain or discomfort when this is done.   Your caregiver will record the location of the abnormal areas and send the tissue samples to a lab for analysis.   If your caregiver biopsies the vagina or outside of the vagina, a local anesthetic (novocaine) is usually given.  AFTER THE PROCEDURE  You may have some cramping that often goes away in a few minutes. You may have some soreness for a couple of days.   You may take over-the-counter pain medicine as advised by your caregiver. Do not take aspirin because it can cause bleeding.   Lie down for a few minutes if you feel lightheaded.   You may have some bleeding or dark discharge that should stop in a few days.   You may need to wear a sanitary pad for a few days.  HOME CARE INSTRUCTIONS  Avoid sex, douching, and using tampons for a week or as directed.   Only take medicine as directed by your caregiver.   Continue to take birth control pills, if you are on them.   Not all test results are available during your   visit. If your test results are not back during the visit, make an appointment with your caregiver to find out the results. Do not assume everything is normal if you have not heard from your caregiver or the medical facility. It is important for you to follow up on all of your test results.   Follow your caregiver's advice regarding medicines, activity, follow-up visits, and follow-up Pap tests.  SEEK MEDICAL CARE IF:  You develop a rash.   You have problems with your medicine.  SEEK MEDICAL CARE IMMEDIATELY IF YOU:  Are bleeding heavily or are passing blood clots.   Develop a fever over 102 F (38.9 C), with or without chills.   Have abnormal vaginal discharge.   Are having cramps that do not go away after taking your  pain medicine.   Feel lightheaded, dizzy, or faint.   Develop stomach pain.  Document Released: 04/12/2002 Document Re-Released: 04/16/2009 Exit[employer] Patient Information 2011 [employer]. 

## 2010-10-14 NOTE — Progress Notes (Signed)
Colposcopy Procedure Note  Indications: Pap smear 3 months ago showed: low-grade squamous intraepithelial neoplasia (LGSIL - encompassing HPV,mild dysplasia,CIN I). The prior pap showed no abnormalities. Prior cervical treatment: no treatment. Patient has a history of 3 vaginal deliveries and one c-section. Stopped having menses in her mid-30's and had a BTL at the time of her c-section 25 yrs ago. Has HIV and Hep C, has never had an abnormal pap per her report. Denies any current sexual activity.  Procedure Details  The risks and benefits of the procedure and Written informed consent obtained.  Speculum placed in vagina and excellent visualization of cervix achieved, cervix swabbed x 3 with acetic acid solution.  Findings: Cervix: visible lesion(s) at 6 o'clock, acetowhite lesion(s) noted at 12 o'clock and mosaicism noted at 6 and 12 o'clock; cervix swabbed with Lugol's solution, SCJ visualized 360 degrees without lesions, endocervical lesion noted at 6 and 12 o'clock, endocervical curettage performed, cervical biopsies taken at 6 and 12 o'clock, specimen labelled and sent to pathology and hemostasis achieved with Monsel's solution. Vaginal inspection: vaginal colposcopy not performed. Vulvar colposcopy: vulvar colposcopy not performed.  Specimens: Biopsies at 6 and 12 o'clock and ECC  Complications: pain. Patient given 800mg  PO Motrin and script for the same.  Plan: Specimens labelled and sent to Pathology. Will base further treatment on Pathology findings. Return to discuss Pathology results in 2 weeks.

## 2010-11-13 ENCOUNTER — Encounter: Payer: Self-pay | Admitting: Family Medicine

## 2010-11-13 ENCOUNTER — Ambulatory Visit (INDEPENDENT_AMBULATORY_CARE_PROVIDER_SITE_OTHER): Payer: Self-pay | Admitting: Family Medicine

## 2010-11-13 VITALS — BP 145/84 | HR 79 | Temp 98.6°F | Ht 62.0 in | Wt 132.0 lb

## 2010-11-13 DIAGNOSIS — IMO0002 Reserved for concepts with insufficient information to code with codable children: Secondary | ICD-10-CM

## 2010-11-13 DIAGNOSIS — R87612 Low grade squamous intraepithelial lesion on cytologic smear of cervix (LGSIL): Secondary | ICD-10-CM

## 2010-11-13 DIAGNOSIS — Z23 Encounter for immunization: Secondary | ICD-10-CM

## 2010-11-13 MED ORDER — INFLUENZA VIRUS VACC SPLIT PF IM SUSP
0.5000 mL | Freq: Once | INTRAMUSCULAR | Status: AC
  Administered 2010-11-13: 0.5 mL via INTRAMUSCULAR

## 2010-11-13 NOTE — Patient Instructions (Signed)
Cryotherapy for Abnormal Cervical Cell Changes  Cryotherapy destroys abnormal tissue on the cervix by freezing it. Cryotherapy destroys some normal tissue along with the abnormal tissue. During cryotherapy, liquid carbon dioxide (CO2), which is very cold, circulates through a probe placed next to the abnormal tissue. This freezes the tissue for 2 to 3 minutes. It may be allowed to thaw and then be refrozen for another 2 to 3 minutes. A single freeze treatment for 5 minutes may also be used. Cryotherapy causes some discomfort. Most women feel a sensation of cold and a little cramping, and sometimes a sense of warmth spreads to the upper body and face.  Recommended Related to Cervical Cancer Cellular Classification of Cervical Cancer  Squamous cell (epidermoid) carcinoma comprises approximately 90%, and adenocarcinoma comprises approximately 10% of cervical cancers. Adenosquamous and small cell carcinomas are relatively rare. Primary sarcomas of the cervix have been described occasionally, and malignant lymphomas of the cervix, primary and secondary, have also been reported.  Read the Cellular Classification of Cervical Cancer article > >  Cryotherapy is not adequate treatment if abnormal cells are high in the cervical canal. In that case, another treatment, such as a cone biopsy, will be recommended instead of cryotherapy. How it is done Cryotherapy is usually done at your doctor's office, a clinic, or a hospital as an outpatient procedure (you do not have to spend a night in the hospital). You will need to take off your clothes below the waist and drape a paper or cloth covering around your waist. You will then lie on your back on an exam table with your feet raised and supported by footrests (stirrups). Your doctor will insert an instrument with curved blades (speculum) into your vagina. The speculum gently spreads apart the vaginal walls, allowing the inside of the vagina and the cervix to be  examined. Your doctor may use medicine to numb the cervix (cervical block). What To Expect After Surgery  Most women are able to return to their normal activity level the day after the cryotherapy procedure. After cryotherapy A watery vaginal discharge will occur for about 2 to 3 weeks.  Pads should be used instead of tampons for 2 to 3 weeks.  Sexual intercourse should be avoided for 2 to 3 weeks.  Douching should not be done for 2 to 3 weeks.  When to call your doctor Call your doctor if you have any of the following symptoms: A fever  Moderate to heavy bleeding (more than you would usually have during a menstrual period)  Increasing pelvic pain  Bad-smelling or yellowish vaginal discharge, which may point to an infection  Why It Is Done  Cryotherapy is done when abnormal Pap test results have been confirmed by colposcopy. If the results of endocervical curettage do not show abnormal tissue high inside the cervical canal, then cryotherapy can be used to treat the abnormal tissue seen with colposcopy. How Well It Works  Cryotherapy is an effective method for destroying abnormal cervical tissue, depending on the size, depth, and type of abnormal tissue. Studies have had differing results, but cryotherapy appears to destroy all of the abnormal tissue in 77% to 96% of cases.1 Risks  Destruction of the abnormal tissue will not be complete if the abnormal cells are too deep in the cervical tissue. What To Think About  If you have cryotherapy, you need regular follow-up Pap tests. Pap tests should be repeated every 4 to 6 months or as recommended by your doctor. After several Pap  test results are normal, you and your doctor can decide how often to schedule future Pap tests.  Source: Web MD

## 2010-11-13 NOTE — Progress Notes (Signed)
Patient is 44-yo G4P4 who presents today for f/u on her colpo results. She has had no issues after her biopsies. She states she is done childbearing. She gets regular care for her HIV with Dr Orvan Falconer. She desires a flu shot today.  FINAL DIAGNOSIS Diagnosis 1. Cervix, biopsy, 12 o'clock - LOW GRADE SQUAMOUS INTRAEPITHELIAL LESION, CIN-I (MILD DYSPLASIA). 2. Cervix, biopsy, 6 O'clock - LOW GRADE SQUAMOUS INTRAEPITHELIAL LESION, CIN-I (MILD DYSPLASIA). 3. Endocervix, curettage - SCANT BENIGN ENDOCERVIX AND ABUNDANT MUCUS. Jimmy Picket MD Pathologist, Electronic Signature (Case signed 10/16/2010)   A/P LSIL: Plan for cryotherapy. Procedure discussed with the patient. She has off from work on Tuesday and Wednesday; will try to schedule on Monday afternoon. HIV: Cont care with Dr Orvan Falconer. Flu vaccine today.

## 2010-11-13 NOTE — Progress Notes (Signed)
Addended by: Faythe Casa on: 11/13/2010 04:33 PM   Modules accepted: Orders

## 2011-01-01 ENCOUNTER — Encounter: Payer: Self-pay | Admitting: Obstetrics and Gynecology

## 2011-01-01 ENCOUNTER — Ambulatory Visit (INDEPENDENT_AMBULATORY_CARE_PROVIDER_SITE_OTHER): Payer: Self-pay | Admitting: Obstetrics and Gynecology

## 2011-01-01 ENCOUNTER — Other Ambulatory Visit: Payer: Self-pay | Admitting: Obstetrics and Gynecology

## 2011-01-01 DIAGNOSIS — R8789 Other abnormal findings in specimens from female genital organs: Secondary | ICD-10-CM

## 2011-01-01 DIAGNOSIS — IMO0002 Reserved for concepts with insufficient information to code with codable children: Secondary | ICD-10-CM

## 2011-01-01 DIAGNOSIS — B977 Papillomavirus as the cause of diseases classified elsewhere: Secondary | ICD-10-CM

## 2011-01-01 DIAGNOSIS — R87612 Low grade squamous intraepithelial lesion on cytologic smear of cervix (LGSIL): Secondary | ICD-10-CM | POA: Insufficient documentation

## 2011-01-01 DIAGNOSIS — B20 Human immunodeficiency virus [HIV] disease: Secondary | ICD-10-CM

## 2011-01-01 NOTE — Progress Notes (Signed)
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  Cryotherapy details The indications for cryotherapy were reviewed with the patient in detail. She was counseled about that efficacy of this procedure, and possible need for excisional procedure in the future if her cervical dysplasia persists.  The risks of the procedure where explained in detail and patient was told to expect a copious amount of discharge in the next few weeks. All her questions were answered, and written informed consent was obtained.  The patient was placed in the dorsal lithotomy position and a vaginal speculum was placed. Her cervix was visualized and patient was noted to have had normal size transformation zone. The appropriate cryotherapy probe was picked and affixed to cryotherapy apparatus. Then nitrogen gas was then activated, the probe was coated with lubricating jelly and applied to the transformation zone of the cervix. This was kept in place for 3 minutes. The cryotherapy was then stopped and all instruments were removed from the patient's pelvis; a thawing period of 3 minutes was observed.  A second cycle of cryotherapy was then administered to the cervix for 3 minutes.  The patient tolerated the procedure well without any complications. Routine post procedure instructions were given to the patient.  Patient is to return to the clinic in 4 months for a repeat Pap smear. She will need Pap smears every 6 months until she has had at least 2 consecutive normal Paps. She can then resume normal annual screening.

## 2011-01-03 ENCOUNTER — Telehealth: Payer: Self-pay | Admitting: *Deleted

## 2011-01-03 NOTE — Telephone Encounter (Signed)
Pt left message stating that she needs letter for work- had colpo on 01/01/11.  I called and left message for pt that if she still needs letter she may call back to the front desk and they will be able to assist her.

## 2011-01-09 ENCOUNTER — Other Ambulatory Visit: Payer: Self-pay | Admitting: Infectious Diseases

## 2011-01-09 ENCOUNTER — Other Ambulatory Visit (INDEPENDENT_AMBULATORY_CARE_PROVIDER_SITE_OTHER): Payer: Self-pay

## 2011-01-09 DIAGNOSIS — Z113 Encounter for screening for infections with a predominantly sexual mode of transmission: Secondary | ICD-10-CM

## 2011-01-09 DIAGNOSIS — B20 Human immunodeficiency virus [HIV] disease: Secondary | ICD-10-CM

## 2011-01-09 DIAGNOSIS — Z79899 Other long term (current) drug therapy: Secondary | ICD-10-CM

## 2011-01-10 LAB — COMPLETE METABOLIC PANEL WITH GFR
ALT: 10 U/L (ref 0–35)
Alkaline Phosphatase: 86 U/L (ref 39–117)
Calcium: 9.3 mg/dL (ref 8.4–10.5)
Creat: 0.77 mg/dL (ref 0.50–1.10)
GFR, Est Non African American: >89 mL/min
Glucose, Bld: 111 mg/dL — ABNORMAL HIGH (ref 70–99)
Total Protein: 7.9 g/dL (ref 6.0–8.3)

## 2011-01-10 LAB — CBC WITH DIFFERENTIAL/PLATELET
Basophils Relative: 0 % (ref 0–1)
Lymphocytes Relative: 34 % (ref 12–46)
Lymphs Abs: 2 10*3/uL (ref 0.7–4.0)
MCH: 28.9 pg (ref 26.0–34.0)
RDW: 13.5 % (ref 11.5–15.5)

## 2011-01-10 LAB — URINALYSIS
Ketones, ur: NEGATIVE mg/dL
Nitrite: NEGATIVE
Specific Gravity, Urine: 1.014 (ref 1.005–1.030)
Urobilinogen, UA: 0.2 mg/dL (ref 0.0–1.0)

## 2011-01-10 LAB — T-HELPER CELL (CD4) - (RCID CLINIC ONLY): CD4 % Helper T Cell: 23 % — ABNORMAL LOW (ref 33–55)

## 2011-01-10 LAB — GC/CHLAMYDIA PROBE AMP, URINE
Chlamydia, Swab/Urine, PCR: NEGATIVE
GC Probe Amp, Urine: NEGATIVE

## 2011-01-15 LAB — HIV-1 RNA QUANT-NO REFLEX-BLD
HIV 1 RNA Quant: 998 copies/mL — ABNORMAL HIGH (ref <20)
HIV-1 RNA Quant, Log: 3 {Log} — ABNORMAL HIGH (ref <1.30)

## 2011-01-23 ENCOUNTER — Ambulatory Visit (INDEPENDENT_AMBULATORY_CARE_PROVIDER_SITE_OTHER): Payer: Self-pay | Admitting: Infectious Diseases

## 2011-01-23 ENCOUNTER — Encounter: Payer: Self-pay | Admitting: Infectious Diseases

## 2011-01-23 ENCOUNTER — Ambulatory Visit: Payer: Self-pay

## 2011-01-23 VITALS — BP 144/77 | HR 90 | Temp 98.3°F | Ht 60.0 in | Wt 142.0 lb

## 2011-01-23 DIAGNOSIS — Z23 Encounter for immunization: Secondary | ICD-10-CM

## 2011-01-23 DIAGNOSIS — B1710 Acute hepatitis C without hepatic coma: Secondary | ICD-10-CM

## 2011-01-23 DIAGNOSIS — B20 Human immunodeficiency virus [HIV] disease: Secondary | ICD-10-CM

## 2011-01-23 NOTE — Progress Notes (Signed)
  Subjective:    Patient ID: Christine Dawson, female    DOB: October 19, 1965, 45 y.o.   MRN: [id number]  HPI 45 yo F with HIV+, prev on Atripla then changed to ISN/TRV due to ADR. Last CD4 450, VL 998 (01-09-11). Had PAP 07-05-10 CIN 1.  Without complaints. Having abn bleeding after cone bx 01-01-11. Denies missed ART .  Review of Systems  Constitutional: Negative for appetite change and unexpected weight change.  Gastrointestinal: Negative for diarrhea and constipation.  Genitourinary: Positive for vaginal bleeding. Negative for dysuria.  Neurological: Negative for headaches.       Objective:   Physical Exam  Constitutional: She appears well-developed and well-nourished.  HENT:  Head: Normocephalic.  Mouth/Throat: No oropharyngeal exudate.  Eyes: EOM are normal. Pupils are equal, round, and reactive to light.  Neck: Neck supple.  Cardiovascular: Normal rate, regular rhythm and normal heart sounds.   Pulmonary/Chest: Effort normal and breath sounds normal.  Abdominal: Soft. Bowel sounds are normal. She exhibits no distension. There is no tenderness.  Lymphadenopathy:    She has no cervical adenopathy.          Assessment & Plan:

## 2011-01-23 NOTE — Assessment & Plan Note (Signed)
She is doing well but has detectable virus. Will send for repeat geno and integrase testing. Will see her back in 4-5 months if no resistance found. Offered condoms, given 2nd hep B.

## 2011-01-23 NOTE — Assessment & Plan Note (Signed)
She has no serologies for this in computer. Will check Hep C AB and VL. Check Hep A ab as well to see if she needs vax.

## 2011-01-24 LAB — HEPATITIS C ANTIBODY: HCV Ab: NEGATIVE

## 2011-01-29 LAB — HEPATITIS C RNA QUANTITATIVE

## 2011-01-30 ENCOUNTER — Ambulatory Visit: Payer: Self-pay | Admitting: Family Medicine

## 2011-02-03 LAB — OTHER SOLSTAS TEST

## 2011-02-10 LAB — HIV-1 GENOTYPR PLUS

## 2011-04-11 ENCOUNTER — Telehealth: Payer: Self-pay | Admitting: *Deleted

## 2011-04-11 NOTE — Telephone Encounter (Signed)
C/o body aches, slight fever, cough, stuffy head since Monday or Tuesday. States she did get the flu shot. No mds here this pm. She wants to be seen next week. I went over home care measures she can take to feel better. Rest, plenty of fluids, otc tylenol, cough syrup. Verbalized understanding Does not have a pcp to go to. Transferred call to [facility] at front to make an appt

## 2011-04-14 ENCOUNTER — Encounter: Payer: Self-pay | Admitting: Infectious Diseases

## 2011-04-14 ENCOUNTER — Ambulatory Visit (INDEPENDENT_AMBULATORY_CARE_PROVIDER_SITE_OTHER): Payer: Self-pay | Admitting: Infectious Diseases

## 2011-04-14 DIAGNOSIS — Z8619 Personal history of other infectious and parasitic diseases: Secondary | ICD-10-CM

## 2011-04-14 DIAGNOSIS — B20 Human immunodeficiency virus [HIV] disease: Secondary | ICD-10-CM

## 2011-04-14 DIAGNOSIS — B977 Papillomavirus as the cause of diseases classified elsewhere: Secondary | ICD-10-CM

## 2011-04-14 DIAGNOSIS — J40 Bronchitis, not specified as acute or chronic: Secondary | ICD-10-CM

## 2011-04-14 DIAGNOSIS — R8789 Other abnormal findings in specimens from female genital organs: Secondary | ICD-10-CM

## 2011-04-14 DIAGNOSIS — IMO0002 Reserved for concepts with insufficient information to code with codable children: Secondary | ICD-10-CM

## 2011-04-14 MED ORDER — AZITHROMYCIN 250 MG PO TABS: ORAL_TABLET | ORAL | Status: AC

## 2011-04-14 NOTE — Assessment & Plan Note (Signed)
Has f/u scheduled for next month.

## 2011-04-14 NOTE — Assessment & Plan Note (Addendum)
Will check her HLA to see if she can go onto EPZ/TFV/DRVr. See her back in 1 month. Need to be carefull with her new rx as she has reported hx of Hep B. Will check her Hep B DNA to see if this is active.

## 2011-04-14 NOTE — Assessment & Plan Note (Signed)
Will check Hep B DNA

## 2011-04-14 NOTE — Progress Notes (Signed)
  Subjective:    Patient ID: Christine Dawson, female    DOB: 14-May-1965, 46 y.o.   MRN: [id number]  HPI 46 yo F with HIV+, prev on Atripla then changed to ISN/TRV due to ADR. Had PAP 07-05-10 CIN 1, then cone bx 01-01-11. Had detectable VL in December 2012 and markedly abn genotype.  Came to make sure she didn't have flu- has had chest cold for last week. Has had chills, no fever. Has had cough prod of yellow phlegm. Taking OTCs (halls, Nyquil, Catering manager plus). Sinus congestion.  States the TRV makes her have abn dreams and breaks her out in hot flashes.   HIV 1 RNA Quant (copies/mL)  Date Value  01/23/2011 3300*  01/09/2011 998*  06/12/2010 174*     CD4 T Cell Abs (cmm)  Date Value  01/09/2011 450   11/21/2009 320*  09/27/2009 290*    Lab Results  Component Value Date   CHOL 159 06/12/2010   HDL 49 06/12/2010   LDLCALC 89 06/12/2010   TRIG 105 06/12/2010   CHOLHDL 3.2 06/12/2010        Review of Systems  Constitutional: Negative for fever, appetite change and unexpected weight change.  Respiratory: Positive for cough.   Gastrointestinal: Negative for diarrhea and constipation.  Genitourinary: Negative for dysuria.       Objective:   Physical Exam  Constitutional: She appears well-developed. No distress.  HENT:  Mouth/Throat: No oropharyngeal exudate.  Eyes: EOM are normal. Pupils are equal, round, and reactive to light.  Neck: Neck supple.  Cardiovascular: Normal rate, regular rhythm and normal heart sounds.   Pulmonary/Chest: Effort normal. She has decreased breath sounds. She has no wheezes. She has no rales.  Abdominal: Soft. Bowel sounds are normal. There is no tenderness.  Lymphadenopathy:    She has no cervical adenopathy.          Assessment & Plan:

## 2011-04-14 NOTE — Assessment & Plan Note (Signed)
Will give her MD excuse, will give her rx for azithro.

## 2011-04-16 LAB — HEPATITIS B DNA, ULTRAQUANTITATIVE, PCR

## 2011-04-18 LAB — HLA B*5701: HLA-B*5701: NEGATIVE

## 2011-05-19 ENCOUNTER — Ambulatory Visit: Payer: Self-pay | Admitting: Infectious Diseases

## 2011-06-11 ENCOUNTER — Encounter: Payer: Self-pay | Admitting: *Deleted

## 2011-06-11 ENCOUNTER — Telehealth: Payer: Self-pay | Admitting: *Deleted

## 2011-06-11 NOTE — Telephone Encounter (Signed)
Phone number unreachable.  Sent letter to to pt asking her to call [facility] to schedule f/u PAP smear appt.

## 2011-06-23 ENCOUNTER — Other Ambulatory Visit: Payer: Self-pay

## 2011-07-07 ENCOUNTER — Ambulatory Visit: Payer: Self-pay | Admitting: Infectious Diseases

## 2011-07-14 ENCOUNTER — Ambulatory Visit: Payer: Self-pay | Admitting: Physician Assistant

## 2011-07-21 ENCOUNTER — Other Ambulatory Visit: Payer: Self-pay

## 2011-07-21 ENCOUNTER — Other Ambulatory Visit: Payer: Self-pay | Admitting: Infectious Diseases

## 2011-07-21 ENCOUNTER — Ambulatory Visit: Payer: Self-pay

## 2011-07-21 DIAGNOSIS — B20 Human immunodeficiency virus [HIV] disease: Secondary | ICD-10-CM

## 2011-07-21 LAB — CBC WITH DIFFERENTIAL/PLATELET
Basophils Absolute: 0 10*3/uL (ref 0.0–0.1)
Basophils Relative: 1 % (ref 0–1)
Eosinophils Absolute: 0.1 10*3/uL (ref 0.0–0.7)
Eosinophils Relative: 1 % (ref 0–5)
HCT: 34.1 % — ABNORMAL LOW (ref 36.0–46.0)
MCH: 27.2 pg (ref 26.0–34.0)
MCHC: 32.6 g/dL (ref 30.0–36.0)
MCV: 83.6 fL (ref 78.0–100.0)
Monocytes Absolute: 0.3 10*3/uL (ref 0.1–1.0)
Monocytes Relative: 7 % (ref 3–12)
RDW: 15 % (ref 11.5–15.5)

## 2011-07-21 LAB — COMPREHENSIVE METABOLIC PANEL
CO2: 26 mEq/L (ref 19–32)
Calcium: 9.2 mg/dL (ref 8.4–10.5)
Chloride: 110 mEq/L (ref 96–112)
Creat: 0.78 mg/dL (ref 0.50–1.10)
Glucose, Bld: 92 mg/dL (ref 70–99)
Total Bilirubin: 0.3 mg/dL (ref 0.3–1.2)

## 2011-07-22 LAB — T-HELPER CELL (CD4) - (RCID CLINIC ONLY)
CD4 % Helper T Cell: 20 % — ABNORMAL LOW (ref 33–55)
CD4 T Cell Abs: 210 uL — ABNORMAL LOW (ref 400–2700)

## 2011-08-04 ENCOUNTER — Telehealth: Payer: Self-pay | Admitting: *Deleted

## 2011-08-04 ENCOUNTER — Ambulatory Visit: Payer: Self-pay | Admitting: Infectious Diseases

## 2011-08-04 NOTE — Telephone Encounter (Signed)
Called pt and left message for her to call the clinic to reschedule her appt.  She no showed today. Wendall Mola

## 2011-09-30 ENCOUNTER — Other Ambulatory Visit: Payer: Self-pay | Admitting: Infectious Diseases

## 2011-09-30 DIAGNOSIS — B20 Human immunodeficiency virus [HIV] disease: Secondary | ICD-10-CM

## 2011-09-30 DIAGNOSIS — Z113 Encounter for screening for infections with a predominantly sexual mode of transmission: Secondary | ICD-10-CM

## 2011-09-30 DIAGNOSIS — Z79899 Other long term (current) drug therapy: Secondary | ICD-10-CM

## 2011-10-02 ENCOUNTER — Ambulatory Visit: Payer: Self-pay

## 2011-10-09 ENCOUNTER — Other Ambulatory Visit: Payer: Self-pay | Admitting: *Deleted

## 2011-10-09 DIAGNOSIS — B20 Human immunodeficiency virus [HIV] disease: Secondary | ICD-10-CM

## 2011-10-09 MED ORDER — EMTRICITABINE-TENOFOVIR DF 200-300 MG PO TABS: 1.0000 | ORAL_TABLET | Freq: Every day | ORAL | Status: DC

## 2011-10-09 MED ORDER — RALTEGRAVIR POTASSIUM 400 MG PO TABS: 400.0000 mg | ORAL_TABLET | Freq: Two times a day (BID) | ORAL | Status: DC

## 2011-10-14 ENCOUNTER — Ambulatory Visit (INDEPENDENT_AMBULATORY_CARE_PROVIDER_SITE_OTHER): Payer: Self-pay

## 2011-10-14 DIAGNOSIS — B20 Human immunodeficiency virus [HIV] disease: Secondary | ICD-10-CM

## 2011-10-14 DIAGNOSIS — Z113 Encounter for screening for infections with a predominantly sexual mode of transmission: Secondary | ICD-10-CM

## 2011-10-14 DIAGNOSIS — Z79899 Other long term (current) drug therapy: Secondary | ICD-10-CM

## 2011-10-14 LAB — CBC WITH DIFFERENTIAL/PLATELET
Basophils Absolute: 0 10*3/uL (ref 0.0–0.1)
Basophils Relative: 1 % (ref 0–1)
Eosinophils Absolute: 0.1 10*3/uL (ref 0.0–0.7)
Eosinophils Relative: 3 % (ref 0–5)
Lymphs Abs: 1.3 10*3/uL (ref 0.7–4.0)
MCH: 28.8 pg (ref 26.0–34.0)
MCHC: 33.6 g/dL (ref 30.0–36.0)
MCV: 85.7 fL (ref 78.0–100.0)
Platelets: 218 10*3/uL (ref 150–400)
RDW: 14.9 % (ref 11.5–15.5)

## 2011-10-14 LAB — COMPREHENSIVE METABOLIC PANEL
AST: 25 U/L (ref 0–37)
Albumin: 4.1 g/dL (ref 3.5–5.2)
Alkaline Phosphatase: 76 U/L (ref 39–117)
Potassium: 5 mEq/L (ref 3.5–5.3)
Sodium: 138 mEq/L (ref 135–145)
Total Bilirubin: 0.3 mg/dL (ref 0.3–1.2)
Total Protein: 8.1 g/dL (ref 6.0–8.3)

## 2011-10-14 LAB — LIPID PANEL
LDL Cholesterol: 54 mg/dL (ref 0–99)
VLDL: 20 mg/dL (ref 0–40)

## 2011-10-15 LAB — T-HELPER CELL (CD4) - (RCID CLINIC ONLY): CD4 T Cell Abs: 190 uL — ABNORMAL LOW (ref 400–2700)

## 2011-11-10 ENCOUNTER — Encounter: Payer: Self-pay | Admitting: Infectious Diseases

## 2011-11-10 ENCOUNTER — Ambulatory Visit (INDEPENDENT_AMBULATORY_CARE_PROVIDER_SITE_OTHER): Payer: Self-pay | Admitting: Infectious Diseases

## 2011-11-10 VITALS — BP 180/86 | HR 86 | Temp 98.5°F | Ht 62.0 in | Wt 126.0 lb

## 2011-11-10 DIAGNOSIS — F17200 Nicotine dependence, unspecified, uncomplicated: Secondary | ICD-10-CM

## 2011-11-10 DIAGNOSIS — I10 Essential (primary) hypertension: Secondary | ICD-10-CM | POA: Insufficient documentation

## 2011-11-10 DIAGNOSIS — Z8619 Personal history of other infectious and parasitic diseases: Secondary | ICD-10-CM

## 2011-11-10 DIAGNOSIS — Z720 Tobacco use: Secondary | ICD-10-CM | POA: Insufficient documentation

## 2011-11-10 DIAGNOSIS — B20 Human immunodeficiency virus [HIV] disease: Secondary | ICD-10-CM

## 2011-11-10 DIAGNOSIS — Z23 Encounter for immunization: Secondary | ICD-10-CM

## 2011-11-10 MED ORDER — LAMIVUDINE-ZIDOVUDINE 150-300 MG PO TABS: 1.0000 | ORAL_TABLET | Freq: Two times a day (BID) | ORAL | Status: DC

## 2011-11-10 MED ORDER — RITONAVIR 100 MG PO TABS: 100.0000 mg | ORAL_TABLET | Freq: Every day | ORAL | Status: DC

## 2011-11-10 MED ORDER — ATAZANAVIR SULFATE 300 MG PO CAPS: 300.0000 mg | ORAL_CAPSULE | Freq: Every day | ORAL | Status: DC

## 2011-11-10 MED ORDER — TENOFOVIR DISOPROXIL FUMARATE 300 MG PO TABS: 300.0000 mg | ORAL_TABLET | Freq: Every day | ORAL | Status: DC

## 2011-11-10 NOTE — Assessment & Plan Note (Addendum)
Will change her to TFV/CBV/ATVr. She states she will take these. Offered pill box. Offered condoms/refused. Gets flu vax, pnvx. rtc 3 months

## 2011-11-10 NOTE — Assessment & Plan Note (Signed)
Needs to make appt, she needs to get her tax records for the appt

## 2011-11-10 NOTE — Progress Notes (Signed)
  Subjective:    Patient ID: Christine Dawson, female    DOB: [birth year], 46 y.o.   MRN: [id number]  HPI 46 yo F with HIV+, prev on Atripla then changed to ISN/TRV due to ADR. Had PAP 07-05-10 CIN 1, then cone bx 01-01-11.  Had detectable VL in December 2012 and abn genotype. She has resistance to all integrase inhibitors, no PIs and some NRTIs (epivir, DDI, ABC). HLA (-), Hep B DNA (-), Hep A ab (+) Has been working hard, at same job for last 2 years.   HIV 1 RNA Quant (copies/mL)  Date Value  10/14/2011 182959*  07/21/2011 756433*  01/23/2011 3300*     CD4 T Cell Abs (cmm)  Date Value  10/14/2011 190*  07/21/2011 210*  01/09/2011 450     Review of Systems  Constitutional: Negative for fever, chills, appetite change and unexpected weight change.  Respiratory: Negative for chest tightness and shortness of breath.   Cardiovascular: Negative for chest pain.  Gastrointestinal: Negative for diarrhea and constipation.  Genitourinary: Negative for dysuria.  Neurological: Negative for headaches.       Objective:   Physical Exam  Constitutional: She appears well-developed and well-nourished.  HENT:  Mouth/Throat: No oropharyngeal exudate.  Eyes: EOM are normal. Pupils are equal, round, and reactive to light.  Neck: Neck supple.  Cardiovascular: Normal rate, regular rhythm and normal heart sounds.   Pulmonary/Chest: Effort normal and breath sounds normal. No respiratory distress. She has no wheezes. She has no rales.  Abdominal: Soft. Bowel sounds are normal. There is no tenderness.  Lymphadenopathy:    She has no cervical adenopathy.          Assessment & Plan:

## 2011-11-10 NOTE — Assessment & Plan Note (Signed)
Repeated and got 140/90. Have asked her to check at drug store and when she comes back for dental appt.

## 2011-11-10 NOTE — Assessment & Plan Note (Signed)
Hep A ab+. Will cont to watch LFTs

## 2011-11-10 NOTE — Assessment & Plan Note (Signed)
DNA

## 2011-11-10 NOTE — Assessment & Plan Note (Signed)
Encouraged to quit, she is cutting back

## 2012-01-23 ENCOUNTER — Telehealth: Payer: Self-pay | Admitting: *Deleted

## 2012-01-23 NOTE — Telephone Encounter (Signed)
Message left requesting pt to call [facility] for f/u PAP smear ([phone]).  Pt NO SHOWED for June 2013 appt for f/u PAP smear.  Reminded of Lab Appt at [facility].

## 2012-02-05 ENCOUNTER — Other Ambulatory Visit: Payer: Self-pay

## 2012-02-18 ENCOUNTER — Ambulatory Visit: Payer: Self-pay | Admitting: Infectious Diseases

## 2012-02-25 ENCOUNTER — Other Ambulatory Visit (INDEPENDENT_AMBULATORY_CARE_PROVIDER_SITE_OTHER): Payer: Self-pay

## 2012-02-25 DIAGNOSIS — I10 Essential (primary) hypertension: Secondary | ICD-10-CM

## 2012-02-25 DIAGNOSIS — Z113 Encounter for screening for infections with a predominantly sexual mode of transmission: Secondary | ICD-10-CM

## 2012-02-25 DIAGNOSIS — B20 Human immunodeficiency virus [HIV] disease: Secondary | ICD-10-CM

## 2012-02-25 LAB — CBC WITH DIFFERENTIAL/PLATELET
Basophils Absolute: 0 10*3/uL (ref 0.0–0.1)
Lymphocytes Relative: 41 % (ref 12–46)
Lymphs Abs: 2.1 10*3/uL (ref 0.7–4.0)
Neutrophils Relative %: 48 % (ref 43–77)
Platelets: 243 10*3/uL (ref 150–400)
RBC: 3.5 MIL/uL — ABNORMAL LOW (ref 3.87–5.11)
RDW: 18.8 % — ABNORMAL HIGH (ref 11.5–15.5)
WBC: 5.1 10*3/uL (ref 4.0–10.5)

## 2012-02-26 LAB — COMPREHENSIVE METABOLIC PANEL
ALT: 9 U/L (ref 0–35)
Albumin: 4.4 g/dL (ref 3.5–5.2)
CO2: 27 mEq/L (ref 19–32)
Calcium: 9.7 mg/dL (ref 8.4–10.5)
Chloride: 105 mEq/L (ref 96–112)
Glucose, Bld: 98 mg/dL (ref 70–99)
Potassium: 4.8 mEq/L (ref 3.5–5.3)
Sodium: 140 mEq/L (ref 135–145)
Total Protein: 8 g/dL (ref 6.0–8.3)

## 2012-02-26 LAB — T-HELPER CELL (CD4) - (RCID CLINIC ONLY): CD4 T Cell Abs: 320 uL — ABNORMAL LOW (ref 400–2700)

## 2012-02-27 LAB — HIV-1 RNA QUANT-NO REFLEX-BLD: HIV 1 RNA Quant: 24 copies/mL — ABNORMAL HIGH (ref <20)

## 2012-03-08 ENCOUNTER — Telehealth: Payer: Self-pay | Admitting: *Deleted

## 2012-03-08 DIAGNOSIS — R8789 Other abnormal findings in specimens from female genital organs: Secondary | ICD-10-CM

## 2012-03-08 NOTE — Telephone Encounter (Signed)
Pt needing follow-up at [facility] after cryotherapy.

## 2012-03-09 ENCOUNTER — Encounter: Payer: Self-pay | Admitting: Obstetrics & Gynecology

## 2012-03-10 ENCOUNTER — Ambulatory Visit: Payer: Self-pay | Admitting: Infectious Diseases

## 2012-03-16 ENCOUNTER — Ambulatory Visit: Payer: Self-pay | Admitting: Infectious Diseases

## 2012-03-16 ENCOUNTER — Ambulatory Visit: Payer: Self-pay

## 2012-03-24 ENCOUNTER — Encounter: Payer: Self-pay | Admitting: Obstetrics & Gynecology

## 2012-03-30 ENCOUNTER — Encounter: Payer: Self-pay | Admitting: Infectious Diseases

## 2012-03-30 ENCOUNTER — Ambulatory Visit: Payer: Self-pay

## 2012-03-30 ENCOUNTER — Ambulatory Visit (INDEPENDENT_AMBULATORY_CARE_PROVIDER_SITE_OTHER): Payer: Self-pay | Admitting: Infectious Diseases

## 2012-03-30 VITALS — BP 143/83 | HR 88 | Temp 98.0°F | Ht 63.0 in | Wt 126.0 lb

## 2012-03-30 DIAGNOSIS — R87612 Low grade squamous intraepithelial lesion on cytologic smear of cervix (LGSIL): Secondary | ICD-10-CM

## 2012-03-30 DIAGNOSIS — B20 Human immunodeficiency virus [HIV] disease: Secondary | ICD-10-CM

## 2012-03-30 DIAGNOSIS — Z79899 Other long term (current) drug therapy: Secondary | ICD-10-CM

## 2012-03-30 DIAGNOSIS — I10 Essential (primary) hypertension: Secondary | ICD-10-CM

## 2012-03-30 DIAGNOSIS — Z720 Tobacco use: Secondary | ICD-10-CM

## 2012-03-30 DIAGNOSIS — Z23 Encounter for immunization: Secondary | ICD-10-CM

## 2012-03-30 DIAGNOSIS — F17200 Nicotine dependence, unspecified, uncomplicated: Secondary | ICD-10-CM

## 2012-03-30 NOTE — Assessment & Plan Note (Signed)
Improved today Will continue to watch.  

## 2012-03-30 NOTE — Assessment & Plan Note (Signed)
Encouraged her to quit smoking.  ?

## 2012-03-30 NOTE — Assessment & Plan Note (Signed)
Will arrange f/u in GYN clinic. Ask for their help with hot flashes as well.

## 2012-03-30 NOTE — Addendum Note (Signed)
Addended by: Wendall Mola A on: 03/30/2012 02:44 PM   Modules accepted: Orders

## 2012-03-30 NOTE — Progress Notes (Signed)
  Subjective:    Patient ID: Christine Dawson, female    DOB: Feb 23, 1965, 47 y.o.   MRN: [id number]  HPI 47 yo F with HIV+, prev on Atripla then changed to ISN/TRV due to ADR. Had PAP 07-05-10 CIN 1, then cone bx 01-01-11.  Had detectable VL in December 2012 and abn genotype. She has resistance to all integrase inhibitors, no PIs and some NRTIs (epivir, DDI, ABC). HLA (-). At last visit was changed to CBV/TFV/ATVr. No problems with meds.  Is going to meet with med assistance [facility] with [facility]   HIV 1 RNA Quant (copies/mL)  Date Value  02/25/2012 24*  10/14/2011 409811*  07/21/2011 914782*     CD4 T Cell Abs (cmm)  Date Value  02/25/2012 320*  10/14/2011 190*  07/21/2011 210*      Review of Systems  Constitutional: Negative for appetite change and unexpected weight change.  Respiratory: Negative for shortness of breath.   Cardiovascular: Negative for chest pain.  Gastrointestinal: Negative for diarrhea and constipation.  Endocrine:       Hot flashes  Genitourinary: Negative for difficulty urinating and menstrual problem.  Neurological: Negative for headaches.       Objective:   Physical Exam  Constitutional: She appears well-developed and well-nourished.  HENT:  Mouth/Throat: No oropharyngeal exudate.  Eyes: EOM are normal. Pupils are equal, round, and reactive to light.  Neck: Neck supple.  Cardiovascular: Normal rate, regular rhythm and normal heart sounds.   Pulmonary/Chest: Effort normal and breath sounds normal.  Abdominal: Soft. Bowel sounds are normal. There is no tenderness.  Musculoskeletal: She exhibits no edema.  Lymphadenopathy:    She has no cervical adenopathy.          Assessment & Plan:

## 2012-03-30 NOTE — Assessment & Plan Note (Signed)
She is doing much better on her salvage regimen. She will 3rd hep B shot today, will check her S Ab at f/u visit. Sign her up for my chart. Will see her back in 4-5 months with labs.

## 2012-04-06 ENCOUNTER — Ambulatory Visit: Payer: Self-pay

## 2012-05-26 ENCOUNTER — Other Ambulatory Visit: Payer: Self-pay | Admitting: Licensed Clinical Social Worker

## 2012-05-26 DIAGNOSIS — B20 Human immunodeficiency virus [HIV] disease: Secondary | ICD-10-CM

## 2012-05-26 MED ORDER — RITONAVIR 100 MG PO TABS: 100.0000 mg | ORAL_TABLET | Freq: Every day | ORAL | Status: DC

## 2012-05-26 MED ORDER — ATAZANAVIR SULFATE 300 MG PO CAPS: 300.0000 mg | ORAL_CAPSULE | Freq: Every day | ORAL | Status: DC

## 2012-05-26 MED ORDER — TENOFOVIR DISOPROXIL FUMARATE 300 MG PO TABS: 300.0000 mg | ORAL_TABLET | Freq: Every day | ORAL | Status: DC

## 2012-05-26 MED ORDER — LAMIVUDINE-ZIDOVUDINE 150-300 MG PO TABS: 1.0000 | ORAL_TABLET | Freq: Two times a day (BID) | ORAL | Status: DC

## 2012-08-13 ENCOUNTER — Other Ambulatory Visit: Payer: Self-pay | Admitting: *Deleted

## 2012-08-13 DIAGNOSIS — B20 Human immunodeficiency virus [HIV] disease: Secondary | ICD-10-CM

## 2012-08-13 MED ORDER — TENOFOVIR DISOPROXIL FUMARATE 300 MG PO TABS: 300.0000 mg | ORAL_TABLET | Freq: Every day | ORAL | Status: DC

## 2012-08-13 MED ORDER — LAMIVUDINE-ZIDOVUDINE 150-300 MG PO TABS: 1.0000 | ORAL_TABLET | Freq: Two times a day (BID) | ORAL | Status: DC

## 2012-08-13 MED ORDER — ATAZANAVIR SULFATE 300 MG PO CAPS: 300.0000 mg | ORAL_CAPSULE | Freq: Every day | ORAL | Status: DC

## 2012-08-13 MED ORDER — RITONAVIR 100 MG PO TABS: 100.0000 mg | ORAL_TABLET | Freq: Every day | ORAL | Status: DC

## 2012-09-06 ENCOUNTER — Other Ambulatory Visit: Payer: Self-pay

## 2012-09-09 ENCOUNTER — Other Ambulatory Visit: Payer: Self-pay

## 2012-09-13 ENCOUNTER — Other Ambulatory Visit: Payer: Self-pay

## 2012-09-14 ENCOUNTER — Other Ambulatory Visit: Payer: Self-pay | Admitting: Licensed Clinical Social Worker

## 2012-09-14 DIAGNOSIS — B20 Human immunodeficiency virus [HIV] disease: Secondary | ICD-10-CM

## 2012-09-14 MED ORDER — TENOFOVIR DISOPROXIL FUMARATE 300 MG PO TABS: 300.0000 mg | ORAL_TABLET | Freq: Every day | ORAL | Status: DC

## 2012-09-20 ENCOUNTER — Ambulatory Visit (INDEPENDENT_AMBULATORY_CARE_PROVIDER_SITE_OTHER): Payer: Self-pay

## 2012-09-20 ENCOUNTER — Ambulatory Visit (INDEPENDENT_AMBULATORY_CARE_PROVIDER_SITE_OTHER): Payer: Self-pay | Admitting: Infectious Diseases

## 2012-09-20 ENCOUNTER — Encounter: Payer: Self-pay | Admitting: Infectious Diseases

## 2012-09-20 VITALS — BP 163/95 | HR 97 | Temp 98.1°F | Ht 62.0 in | Wt 121.0 lb

## 2012-09-20 DIAGNOSIS — B20 Human immunodeficiency virus [HIV] disease: Secondary | ICD-10-CM

## 2012-09-20 DIAGNOSIS — R87612 Low grade squamous intraepithelial lesion on cytologic smear of cervix (LGSIL): Secondary | ICD-10-CM

## 2012-09-20 DIAGNOSIS — Z113 Encounter for screening for infections with a predominantly sexual mode of transmission: Secondary | ICD-10-CM

## 2012-09-20 DIAGNOSIS — I10 Essential (primary) hypertension: Secondary | ICD-10-CM

## 2012-09-20 NOTE — Assessment & Plan Note (Signed)
[facility]

## 2012-09-20 NOTE — Assessment & Plan Note (Signed)
Improved (but not normal) on recheck. Will continue to monitor.

## 2012-09-20 NOTE — Assessment & Plan Note (Signed)
Taking ART wel lby her hx. Will check her labs today. Will sign her up for my chart. Give her condoms.

## 2012-09-20 NOTE — Progress Notes (Signed)
  Subjective:    Patient ID: Christine Dawson, female    DOB: Mar 29, 1965, 47 y.o.   MRN: [id number]  HPI 47 yo F with HIV+, prev on Atripla then changed to ISN/TRV due to ADR. Had PAP 07-05-10 CIN 1, then cone bx 01-01-11.  Had detectable VL in December 2012 and abn genotype. She has resistance to all integrase inhibitors, no PIs and some NRTIs (epivir, DDI, ABC). HLA (-).  At last visit was changed to CBV/TFV/ATVr. No problems with ART.  Today having vaginal d/c- has had for 1 week. No new sex partners. No f/c. Occas dysuria. Not sexually active. No external lesions.  Still having hot flashes. [facility] yr [facility].  On no anti-htn rx.   HIV 1 RNA Quant (copies/mL)  Date Value  02/25/2012 24*  10/14/2011 119147*  07/21/2011 829562*     CD4 T Cell Abs (cmm)  Date Value  02/25/2012 320*  10/14/2011 190*  07/21/2011 210*     Review of Systems  Constitutional: Negative for fever, chills, appetite change and unexpected weight change.  Respiratory: Negative for shortness of breath.   Cardiovascular: Negative for chest pain.  Gastrointestinal: Negative for diarrhea and constipation.  Genitourinary: Positive for vaginal discharge. Negative for dysuria, difficulty urinating, genital sores and dyspareunia.  Neurological: Negative for headaches.   Bp repeated 150/90    Objective:   Physical Exam  Constitutional: She appears well-developed and well-nourished.  HENT:  Mouth/Throat: No oropharyngeal exudate.  Eyes: EOM are normal. Pupils are equal, round, and reactive to light.  Neck: Neck supple.  Cardiovascular: Normal rate, regular rhythm and normal heart sounds.   Pulmonary/Chest: Effort normal and breath sounds normal.  Abdominal: Soft. Bowel sounds are normal. She exhibits no distension. There is no tenderness.  Musculoskeletal: She exhibits no edema.  Lymphadenopathy:    She has no cervical adenopathy.          Assessment & Plan:

## 2012-09-21 LAB — URINALYSIS, ROUTINE W REFLEX MICROSCOPIC
Glucose, UA: NEGATIVE mg/dL
Hgb urine dipstick: NEGATIVE
Ketones, ur: NEGATIVE mg/dL
Leukocytes, UA: NEGATIVE
Protein, ur: NEGATIVE mg/dL
pH: 5 (ref 5.0–8.0)

## 2012-09-21 LAB — RPR

## 2012-09-21 LAB — HIV-1 RNA ULTRAQUANT REFLEX TO GENTYP+: HIV 1 RNA Quant: <20 copies/mL (ref <20)

## 2012-09-22 ENCOUNTER — Telehealth: Payer: Self-pay | Admitting: *Deleted

## 2012-09-22 NOTE — Telephone Encounter (Signed)
Called patient and notified her of appt at [facility] clinic for 10/28/12 at 3:45. Advised her to please call the clinic if she is unable to keep this appointment. Wendall Mola

## 2012-10-11 ENCOUNTER — Ambulatory Visit: Payer: Self-pay

## 2012-10-13 ENCOUNTER — Other Ambulatory Visit: Payer: Self-pay | Admitting: *Deleted

## 2012-10-13 DIAGNOSIS — B20 Human immunodeficiency virus [HIV] disease: Secondary | ICD-10-CM

## 2012-10-13 MED ORDER — TENOFOVIR DISOPROXIL FUMARATE 300 MG PO TABS: 300.0000 mg | ORAL_TABLET | Freq: Every day | ORAL | Status: DC

## 2012-10-13 MED ORDER — RITONAVIR 100 MG PO TABS: 100.0000 mg | ORAL_TABLET | Freq: Every day | ORAL | Status: DC

## 2012-10-13 MED ORDER — ATAZANAVIR SULFATE 300 MG PO CAPS: 300.0000 mg | ORAL_CAPSULE | Freq: Every day | ORAL | Status: DC

## 2012-10-13 MED ORDER — LAMIVUDINE-ZIDOVUDINE 150-300 MG PO TABS: 1.0000 | ORAL_TABLET | Freq: Two times a day (BID) | ORAL | Status: DC

## 2012-10-28 ENCOUNTER — Ambulatory Visit: Payer: Self-pay | Admitting: Obstetrics and Gynecology

## 2012-10-28 ENCOUNTER — Ambulatory Visit (INDEPENDENT_AMBULATORY_CARE_PROVIDER_SITE_OTHER): Payer: Self-pay | Admitting: Obstetrics and Gynecology

## 2012-10-28 VITALS — BP 142/80 | HR 78 | Temp 98.1°F | Ht 63.0 in | Wt 130.0 lb

## 2012-10-28 DIAGNOSIS — Z124 Encounter for screening for malignant neoplasm of cervix: Secondary | ICD-10-CM

## 2012-10-28 NOTE — Progress Notes (Signed)
Patient ID: BRIYANNA BILLINGHAM, female   DOB: 1965-08-15, 47 y.o.   MRN: [id number] 47 yo G4P4 with HIV and history of abnormal pap smear in 2012 (LSIL) here for repeat pap smear. Patient already had her annual exam and is scheduled to have her mammogram next week. Patient is without complaints.  GENERAL: Well-developed, well-nourished female in no acute distress.  ABDOMEN: Soft, nontender, nondistended. No organomegaly. PELVIC: Normal external female genitalia. Vagina is pink and rugated.  Normal discharge. Normal appearing cervix. Uterus is normal in size. No adnexal mass or tenderness. EXTREMITIES: No cyanosis, clubbing, or edema, 2+ distal pulses.  A/P 47 yo with HIV and h/o abnormal pap smear here for cervical cancer screening - pap smear collected - patient will be contacted with any abnormal results - rtc in 1 year or prn

## 2012-11-05 ENCOUNTER — Other Ambulatory Visit: Payer: Self-pay | Admitting: Obstetrics and Gynecology

## 2012-11-05 MED ORDER — METRONIDAZOLE 500 MG PO TABS: 500.0000 mg | ORAL_TABLET | Freq: Two times a day (BID) | ORAL | Status: DC

## 2012-11-09 ENCOUNTER — Telehealth: Payer: Self-pay | Admitting: *Deleted

## 2012-11-09 DIAGNOSIS — A599 Trichomoniasis, unspecified: Secondary | ICD-10-CM

## 2012-11-09 MED ORDER — METRONIDAZOLE 500 MG PO TABS: 500.0000 mg | ORAL_TABLET | Freq: Two times a day (BID) | ORAL | Status: DC

## 2012-11-09 NOTE — Telephone Encounter (Signed)
Called patient. Informed her of results, rx sent to her pharmacy.

## 2012-11-09 NOTE — Telephone Encounter (Signed)
Message copied by Mannie Stabile on Tue Nov 09, 2012 10:04 AM ------      Message from: Christine Dawson      Created: Fri Nov 05, 2012  8:10 AM       Please inform patient of positive trich on pap smear. Flagyl e-prescribed. Advised to abstain from intercourse until completion of treatment and until partner is treated as well. ------

## 2012-12-09 ENCOUNTER — Other Ambulatory Visit: Payer: Self-pay

## 2013-01-13 ENCOUNTER — Encounter: Payer: Self-pay | Admitting: Internal Medicine

## 2013-01-13 ENCOUNTER — Ambulatory Visit (INDEPENDENT_AMBULATORY_CARE_PROVIDER_SITE_OTHER): Payer: Self-pay | Admitting: Internal Medicine

## 2013-01-13 ENCOUNTER — Encounter (HOSPITAL_COMMUNITY): Payer: Self-pay | Admitting: Emergency Medicine

## 2013-01-13 ENCOUNTER — Telehealth: Payer: Self-pay | Admitting: *Deleted

## 2013-01-13 ENCOUNTER — Emergency Department (HOSPITAL_COMMUNITY)
Admission: EM | Admit: 2013-01-13 | Discharge: 2013-01-13 | Disposition: A | Discharge status: Home / Self Care | Payer: Self-pay | Attending: Emergency Medicine | Admitting: Emergency Medicine

## 2013-01-13 VITALS — Temp 98.2°F | Wt 124.0 lb

## 2013-01-13 DIAGNOSIS — IMO0002 Reserved for concepts with insufficient information to code with codable children: Secondary | ICD-10-CM | POA: Insufficient documentation

## 2013-01-13 DIAGNOSIS — L509 Urticaria, unspecified: Secondary | ICD-10-CM | POA: Insufficient documentation

## 2013-01-13 DIAGNOSIS — R21 Rash and other nonspecific skin eruption: Secondary | ICD-10-CM | POA: Insufficient documentation

## 2013-01-13 DIAGNOSIS — T162XXA Foreign body in left ear, initial encounter: Secondary | ICD-10-CM

## 2013-01-13 DIAGNOSIS — Y929 Unspecified place or not applicable: Secondary | ICD-10-CM | POA: Insufficient documentation

## 2013-01-13 DIAGNOSIS — T169XXA Foreign body in ear, unspecified ear, initial encounter: Secondary | ICD-10-CM | POA: Insufficient documentation

## 2013-01-13 DIAGNOSIS — Z23 Encounter for immunization: Secondary | ICD-10-CM

## 2013-01-13 DIAGNOSIS — Z79899 Other long term (current) drug therapy: Secondary | ICD-10-CM | POA: Insufficient documentation

## 2013-01-13 DIAGNOSIS — B20 Human immunodeficiency virus [HIV] disease: Secondary | ICD-10-CM

## 2013-01-13 DIAGNOSIS — Z21 Asymptomatic human immunodeficiency virus [HIV] infection status: Secondary | ICD-10-CM | POA: Insufficient documentation

## 2013-01-13 DIAGNOSIS — H9209 Otalgia, unspecified ear: Secondary | ICD-10-CM | POA: Insufficient documentation

## 2013-01-13 DIAGNOSIS — Y939 Activity, unspecified: Secondary | ICD-10-CM | POA: Insufficient documentation

## 2013-01-13 DIAGNOSIS — F17200 Nicotine dependence, unspecified, uncomplicated: Secondary | ICD-10-CM | POA: Insufficient documentation

## 2013-01-13 MED ORDER — HYDROXYZINE HCL 10 MG PO TABS: 10.0000 mg | ORAL_TABLET | Freq: Three times a day (TID) | ORAL | Status: DC | PRN

## 2013-01-13 MED ORDER — HYDROXYZINE HCL 25 MG PO TABS
25.0000 mg | ORAL_TABLET | Freq: Once | ORAL | Status: AC
  Administered 2013-01-13: 25 mg via ORAL
  Filled 2013-01-13: qty 1

## 2013-01-13 MED ORDER — ANTIPYRINE-BENZOCAINE 5.4-1.4 % OT SOLN
3.0000 [drp] | Freq: Once | OTIC | Status: AC
  Administered 2013-01-13: 4 [drp] via OTIC
  Filled 2013-01-13: qty 10

## 2013-01-13 NOTE — Telephone Encounter (Signed)
Patient called and advised she woke up this morning with bite marks all over her. She advised her face is swollen and wants to be seen. Advised her we have an opening today at 46 with Dr Luciana Axe. She advised she will be here.

## 2013-01-13 NOTE — ED Notes (Signed)
Pt c/o hives to arms and face today with itching

## 2013-01-13 NOTE — Progress Notes (Signed)
   Subjective:    Patient ID: Christine Dawson, female    DOB: December 21, 1965, 48 y.o.   MRN: [id number]  HPI  She comes in for a work in visit. She has a history of HIV and recently well controlled on her regimen ofIsetress and Truvada. She reports excellent continued compliance. She is here to 2 hives. She noted left facial swelling and right arm hives that developed when she woke up this morning. No history of hives in the past. No new medicines, no new foods or unusual contacts. No fever or chills  Review of Systems  Constitutional: Negative for fever and chills.  HENT: Positive for facial swelling. Negative for ear discharge, ear pain and hearing loss.   Gastrointestinal: Negative for nausea and diarrhea.  Skin: Positive for rash.       Hives on right arm  Neurological: Negative for dizziness and light-headedness.       Objective:   Physical Exam  Constitutional: She is oriented to person, place, and time. She appears well-developed and well-nourished. No distress.  HENT:  Left ear notable for a foreign body. She has an insect in her ear.   Left facial swelling  Eyes: Right eye exhibits no discharge. Left eye exhibits no discharge. No scleral icterus.  Cardiovascular: Normal rate, regular rhythm and normal heart sounds.   No murmur heard. Lymphadenopathy:    She has no cervical adenopathy.  Neurological: She is alert and oriented to person, place, and time.  Skin: Rash noted.  hives  Psychiatric: She has a normal mood and affect. Her behavior is normal.          Assessment & Plan:

## 2013-01-13 NOTE — Assessment & Plan Note (Signed)
I will check her labs today. She reports good compliance with her salvage regimen.

## 2013-01-13 NOTE — ED Provider Notes (Signed)
CSN: [id number]     Arrival date & time 01/13/13  1531 History  This chart was scribed for non-physician practitioner Jaynie Crumble, PA-C, working with Gerhard Munch, MD by Dorothey Baseman, ED Scribe. This patient was seen in room TR09C/TR09C and the patient's care was started at 4:30 PM.    Chief Complaint  Patient presents with  . Urticaria   The history is provided by the patient. No language interpreter was used.   HPI Comments: Christine Dawson is a 47 y.o. female with a history of HIV who presents to the Emergency Department complaining of a foreign body in the left ear, possibly an insect. Patient also reports an itching, urticarial rash to the bilateral arms and face onset earlier today, about 12 hours ago. She denies taking any medications at home to manage her symptoms. Patient was seen by her PCP earlier today and was treated for the rash, but the patient was advised to come to the ED for her ear complaints, which her PCP suspected may be the cause of the rash.   Past Medical History  Diagnosis Date  . Abnormal Pap smear 08/2010  . HIV infection    Past Surgical History  Procedure Laterality Date  . Cesarean section  1987   Family History  Problem Relation Age of Onset  . Cancer Other   . Cancer Mother 42    breast  . Hypertension Mother   . Hypertension Sister    History  Substance Use Topics  . Smoking status: Current Every Day Smoker -- 0.40 packs/day    Types: Cigarettes  . Smokeless tobacco: Never Used  . Alcohol Use: 1.0 oz/week    2 drink(s) per week     Comment: beer   OB History   Grav Para Term Preterm Abortions TAB SAB Ect Mult Living   4 4 4  0 0 0 0 0 0 4     Review of Systems  HENT: Positive for ear pain.   Skin: Positive for rash.  All other systems reviewed and are negative.    Allergies  Review of patient's allergies indicates no known allergies.  Home Medications   Current Outpatient Rx  Name  Route  Sig  Dispense  Refill  .  atazanavir (REYATAZ) 300 MG capsule   Oral   Take 1 capsule (300 mg total) by mouth daily with breakfast.   30 capsule   5   . lamiVUDine-zidovudine (COMBIVIR) 150-300 MG per tablet   Oral   Take 1 tablet by mouth 2 (two) times daily.   60 tablet   5   . ritonavir (NORVIR) 100 MG TABS tablet   Oral   Take 1 tablet (100 mg total) by mouth daily.   30 tablet   5   . tenofovir (VIREAD) 300 MG tablet   Oral   Take 1 tablet (300 mg total) by mouth daily.   30 tablet   5    Triage Vitals: BP 146/81  Pulse 105  Temp(Src) 98.1 F (36.7 C) (Oral)  Resp 16  Ht 5\' 3"  (1.6 m)  Wt 125 lb 12.8 oz (57.063 kg)  BMI 22.29 kg/m2  SpO2 98%  Physical Exam  Nursing note and vitals reviewed. Constitutional: She is oriented to person, place, and time. She appears well-developed and well-nourished. No distress.  HENT:  Head: Normocephalic and atraumatic.  Right Ear: Hearing, tympanic membrane, external ear and ear canal normal.  Left Ear: Hearing normal.  Insect in the left ear.  Eyes: Conjunctivae are normal.  Neck: Normal range of motion. Neck supple.  Pulmonary/Chest: Effort normal. No respiratory distress.  Abdominal: She exhibits no distension.  Musculoskeletal: Normal range of motion.  Neurological: She is alert and oriented to person, place, and time.  Skin: Skin is warm and dry.  Psychiatric: She has a normal mood and affect. Her behavior is normal.    ED Course  FOREIGN BODY REMOVAL Date/Time: 01/13/2013 5:00 PM Performed by: Jaynie Crumble A Authorized by: Jaynie Crumble A Consent: Verbal consent obtained. written consent not obtained. Patient understanding: patient states understanding of the procedure being performed Body area: ear Location details: left ear Patient sedated: no Patient restrained: no Patient cooperative: yes Localization method: ENT speculum Removal mechanism: alligator forceps and irrigation Complexity: simple 1 objects  recovered. Objects recovered: insect - roach Post-procedure assessment: foreign body removed Patient tolerance: Patient tolerated the procedure well with no immediate complications.   (including critical care time)  DIAGNOSTIC STUDIES: Oxygen Saturation is 98% on room air, normal by my interpretation.    COORDINATION OF CARE: 4:33 PM- Will remove the insect from the ear. Will order hydroxyzine to manage the itching. Discussed treatment plan with patient at bedside and patient verbalized agreement.   5:05 PM- The foreign body was successfully extracted. Will discharge patient with hydroxyzine and Auralgan to manage symptoms. Discussed treatment plan with patient at bedside and patient verbalized agreement.    Labs Review Labs Reviewed  T-HELPER CELL (CD4)   Imaging Review No results found.  EKG Interpretation   None       MDM   1. Hives   2. Foreign body of left ear, initial encounter     Patient with diffuse high and itching starting this morning. She has already been seen by her primary care Dr. Her CD4 and labs was drawn at that time. She was sent here from her Dr. for foreign body in left ear which they could not get out. I have gotten an insect out of that ear using alligator forceps and some warm water irrigation. Her ear on the reexamination is clear, TM is intact, ear canal is erythematous. I did start her on hydroxyzine for hives. Auralgan eardrops given for the pain and irritation in her ear. She is to followup with her Dr.  Ceasar Mons Vitals:   01/13/13 1537 01/13/13 1713  BP: 146/81 160/97  Pulse: 105 71  Temp: 98.1 F (36.7 C)   TempSrc: Oral   Resp: 16 18  Height: 5\' 3"  (1.6 m)   Weight: 125 lb 12.8 oz (57.063 kg)   SpO2: 98% 100%    I personally performed the services described in this documentation, which was scribed in my presence. The recorded information has been reviewed and is accurate.    Lottie Mussel, PA-C 01/13/13 1811

## 2013-01-13 NOTE — Assessment & Plan Note (Signed)
She does have an insect however after multiple attempts using a curet, hemostat and tweezers, I was unable to remove the foreign body. I am going to have to have her seen in the emergency room were better equipped to remove it.

## 2013-01-13 NOTE — ED Provider Notes (Signed)
  Medical screening examination/treatment/procedure(s) were performed by non-physician practitioner and as supervising physician I was immediately available for consultation/collaboration.  EKG Interpretation   None          Gerhard Munch, MD 01/13/13 1929

## 2013-01-14 LAB — T-HELPER CELL (CD4) - (RCID CLINIC ONLY)
CD4 % Helper T Cell: 15 % — ABNORMAL LOW (ref 33–55)
CD4 T Cell Abs: 200 /uL — ABNORMAL LOW (ref 400–2700)

## 2013-01-14 LAB — HIV-1 RNA QUANT-NO REFLEX-BLD
HIV 1 RNA Quant: <20 copies/mL (ref <20)
HIV-1 RNA Quant, Log: <1.3 {Log} (ref <1.30)

## 2013-01-31 ENCOUNTER — Other Ambulatory Visit: Payer: Self-pay | Admitting: Obstetrics and Gynecology

## 2013-02-15 ENCOUNTER — Ambulatory Visit: Payer: Self-pay | Admitting: Internal Medicine

## 2013-02-15 ENCOUNTER — Telehealth: Payer: Self-pay | Admitting: *Deleted

## 2013-02-15 NOTE — Telephone Encounter (Signed)
Patient no-showed today's follow up from a work-in visit with Dr. Luciana Axeomer last month.  Left message asking patient to call and reschedule with Dr. Ninetta LightsHatcher. [name], [name], RN

## 2013-02-21 ENCOUNTER — Ambulatory Visit: Payer: Self-pay | Admitting: Infectious Diseases

## 2013-02-21 ENCOUNTER — Telehealth: Payer: Self-pay | Admitting: *Deleted

## 2013-02-21 NOTE — Telephone Encounter (Signed)
Called patient and left a voice mail explaining our walk in clinic. Today was patient's 3rd no show. Christine MolaJacqueline Javad Dawson

## 2013-02-24 ENCOUNTER — Other Ambulatory Visit: Payer: Self-pay | Admitting: Obstetrics and Gynecology

## 2013-02-24 ENCOUNTER — Other Ambulatory Visit: Payer: Self-pay | Admitting: Infectious Diseases

## 2013-03-07 ENCOUNTER — Other Ambulatory Visit: Payer: Self-pay

## 2013-03-21 ENCOUNTER — Ambulatory Visit: Payer: Self-pay | Admitting: Infectious Diseases

## 2013-03-23 ENCOUNTER — Ambulatory Visit: Payer: Self-pay | Admitting: Infectious Diseases

## 2013-04-04 ENCOUNTER — Other Ambulatory Visit: Payer: Self-pay | Admitting: Obstetrics and Gynecology

## 2013-04-28 ENCOUNTER — Encounter: Payer: Self-pay | Admitting: *Deleted

## 2013-04-29 ENCOUNTER — Other Ambulatory Visit: Payer: Self-pay | Admitting: *Deleted

## 2013-04-29 ENCOUNTER — Telehealth: Payer: Self-pay | Admitting: *Deleted

## 2013-04-29 ENCOUNTER — Other Ambulatory Visit: Payer: Self-pay | Admitting: Infectious Diseases

## 2013-04-29 DIAGNOSIS — B20 Human immunodeficiency virus [HIV] disease: Secondary | ICD-10-CM

## 2013-04-29 MED ORDER — LAMIVUDINE-ZIDOVUDINE 150-300 MG PO TABS: ORAL_TABLET | ORAL | Status: DC

## 2013-04-29 MED ORDER — ATAZANAVIR SULFATE 300 MG PO CAPS: 300.0000 mg | ORAL_CAPSULE | Freq: Every day | ORAL | Status: DC

## 2013-04-29 MED ORDER — TENOFOVIR DISOPROXIL FUMARATE 300 MG PO TABS: 300.0000 mg | ORAL_TABLET | Freq: Every day | ORAL | Status: DC

## 2013-04-29 MED ORDER — RITONAVIR 100 MG PO TABS: 100.0000 mg | ORAL_TABLET | Freq: Every day | ORAL | Status: DC

## 2013-04-29 NOTE — Telephone Encounter (Signed)
Sent in refills for patient.  Called to ask her to come in for labs/follow up.  Informed her of the no-show policy, our walk in clinic.  She will come Wednesday 4/1 after she gets off work, will arrive between 2-230.  Pt understands she may not be seen, but will be worked in as the schedule allows. [name], Raffi Milstein M, RN

## 2013-05-04 ENCOUNTER — Other Ambulatory Visit (INDEPENDENT_AMBULATORY_CARE_PROVIDER_SITE_OTHER): Payer: Self-pay

## 2013-05-04 ENCOUNTER — Other Ambulatory Visit: Payer: Self-pay | Admitting: Licensed Clinical Social Worker

## 2013-05-04 DIAGNOSIS — B20 Human immunodeficiency virus [HIV] disease: Secondary | ICD-10-CM

## 2013-05-04 LAB — CBC WITH DIFFERENTIAL/PLATELET
Basophils Absolute: 0 10*3/uL (ref 0.0–0.1)
Basophils Relative: 0 % (ref 0–1)
EOS PCT: 3 % (ref 0–5)
Eosinophils Absolute: 0.1 10*3/uL (ref 0.0–0.7)
HCT: 34.2 % — ABNORMAL LOW (ref 36.0–46.0)
HEMOGLOBIN: 11.1 g/dL — AB (ref 12.0–15.0)
Lymphocytes Relative: 35 % (ref 12–46)
Lymphs Abs: 1.6 10*3/uL (ref 0.7–4.0)
MCH: 34.6 pg — ABNORMAL HIGH (ref 26.0–34.0)
MCHC: 32.5 g/dL (ref 30.0–36.0)
MCV: 106.5 fL — AB (ref 78.0–100.0)
Monocytes Absolute: 0.4 10*3/uL (ref 0.1–1.0)
Monocytes Relative: 9 % (ref 3–12)
Neutro Abs: 2.4 10*3/uL (ref 1.7–7.7)
Neutrophils Relative %: 53 % (ref 43–77)
Platelets: 223 10*3/uL (ref 150–400)
RBC: 3.21 MIL/uL — AB (ref 3.87–5.11)
RDW: 16.1 % — ABNORMAL HIGH (ref 11.5–15.5)
WBC: 4.6 10*3/uL (ref 4.0–10.5)

## 2013-05-04 LAB — COMPREHENSIVE METABOLIC PANEL
ALBUMIN: 4.3 g/dL (ref 3.5–5.2)
ALK PHOS: 112 U/L (ref 39–117)
ALT: 16 U/L (ref 0–35)
AST: 21 U/L (ref 0–37)
BUN: 10 mg/dL (ref 6–23)
CO2: 27 mEq/L (ref 19–32)
Calcium: 9.4 mg/dL (ref 8.4–10.5)
Chloride: 103 mEq/L (ref 96–112)
Creat: 0.91 mg/dL (ref 0.50–1.10)
GLUCOSE: 111 mg/dL — AB (ref 70–99)
POTASSIUM: 5.3 meq/L (ref 3.5–5.3)
Sodium: 137 mEq/L (ref 135–145)
TOTAL PROTEIN: 7.8 g/dL (ref 6.0–8.3)
Total Bilirubin: 3.6 mg/dL — ABNORMAL HIGH (ref 0.2–1.2)

## 2013-05-05 LAB — T-HELPER CELL (CD4) - (RCID CLINIC ONLY)
CD4 T CELL HELPER: 23 % — AB (ref 33–55)
CD4 T Cell Abs: 390 /uL — ABNORMAL LOW (ref 400–2700)

## 2013-05-05 LAB — HIV-1 RNA QUANT-NO REFLEX-BLD
HIV 1 RNA Quant: <20 copies/mL (ref <20)
HIV-1 RNA Quant, Log: <1.3 {Log} (ref <1.30)

## 2013-05-19 ENCOUNTER — Encounter: Payer: Self-pay | Admitting: Infectious Diseases

## 2013-05-19 ENCOUNTER — Ambulatory Visit (INDEPENDENT_AMBULATORY_CARE_PROVIDER_SITE_OTHER): Payer: Self-pay | Admitting: Infectious Diseases

## 2013-05-19 VITALS — BP 132/83 | HR 93 | Temp 97.4°F | Wt 124.0 lb

## 2013-05-19 DIAGNOSIS — B20 Human immunodeficiency virus [HIV] disease: Secondary | ICD-10-CM

## 2013-05-19 DIAGNOSIS — R87612 Low grade squamous intraepithelial lesion on cytologic smear of cervix (LGSIL): Secondary | ICD-10-CM

## 2013-05-19 DIAGNOSIS — B1710 Acute hepatitis C without hepatic coma: Secondary | ICD-10-CM

## 2013-05-19 DIAGNOSIS — Z79899 Other long term (current) drug therapy: Secondary | ICD-10-CM

## 2013-05-19 DIAGNOSIS — Z8619 Personal history of other infectious and parasitic diseases: Secondary | ICD-10-CM

## 2013-05-19 DIAGNOSIS — Z23 Encounter for immunization: Secondary | ICD-10-CM

## 2013-05-19 NOTE — Assessment & Plan Note (Signed)
Last PAP normal. Will repeat in fall of 2015.

## 2013-05-19 NOTE — Assessment & Plan Note (Signed)
She is doing well on her salvage regimen. She is offered/refuses condoms. Will see her back in 6 months, PAP will be due then. Will restart her Hep B series.

## 2013-05-19 NOTE — Assessment & Plan Note (Addendum)
Her Hep B DNA is negative as is her S Ag. I am not sure this diagnosis is correct.

## 2013-05-19 NOTE — Assessment & Plan Note (Signed)
Her Hep C Ab is (-). She does not have hep C.  Will mark as resolved.

## 2013-05-19 NOTE — Progress Notes (Signed)
   Subjective:    Patient ID: Christine Dawson, female    DOB: September 27, 1965, 48 y.o.   MRN: [id number]  HPI 48 yo F with HIV+, prev on Atripla then changed to ISN/TRV due to ADR. Had PAP 07-05-10 CIN 1, then cone bx 01-01-11.  Had detectable VL in December 2012 and abn genotype. She has resistance to all integrase inhibitors, no PIs and some NRTIs (epivir, DDI, ABC). HLA (-).  At last visit was changed to CBV/TFV/ATVr.  Hep B S Ab (-) 2014. Complains of tingling/itching in her L arm for last 2 weeks.  No rash. No new soap or shampoos. No new detergents.  No problems with ART.  Last PAP NL (10-2012).   HIV 1 RNA Quant (copies/mL)  Date Value  05/04/2013 <20   01/13/2013 <20   09/20/2012 <20      CD4 T Cell Abs (/uL)  Date Value  05/04/2013 390*  01/13/2013 200*  09/20/2012 270*   Review of Systems  Constitutional: Negative for appetite change and unexpected weight change.  Gastrointestinal: Negative for diarrhea and constipation.  Genitourinary: Negative for vaginal bleeding and difficulty urinating.       Objective:   Physical Exam  Constitutional: She appears well-developed and well-nourished.  HENT:  Mouth/Throat: No oropharyngeal exudate.  Eyes: EOM are normal. Pupils are equal, round, and reactive to light.  Neck: Neck supple.  Cardiovascular: Normal rate, regular rhythm and normal heart sounds.   Pulmonary/Chest: Effort normal and breath sounds normal.  Abdominal: Soft. Bowel sounds are normal. There is no tenderness.  Lymphadenopathy:    She has no cervical adenopathy.          Assessment & Plan:

## 2013-05-20 NOTE — Addendum Note (Signed)
Addended by: Lurlean LeydenPOOLE, TRAVIS F on: 05/20/2013 10:51 AM   Modules accepted: Orders

## 2013-07-04 ENCOUNTER — Other Ambulatory Visit: Payer: Self-pay | Admitting: Obstetrics and Gynecology

## 2013-07-04 ENCOUNTER — Other Ambulatory Visit: Payer: Self-pay | Admitting: Infectious Diseases

## 2013-07-04 DIAGNOSIS — B20 Human immunodeficiency virus [HIV] disease: Secondary | ICD-10-CM

## 2013-07-27 ENCOUNTER — Other Ambulatory Visit: Payer: Self-pay | Admitting: Obstetrics and Gynecology

## 2013-08-17 ENCOUNTER — Other Ambulatory Visit: Payer: Self-pay | Admitting: Obstetrics and Gynecology

## 2013-11-18 ENCOUNTER — Other Ambulatory Visit: Payer: Self-pay

## 2013-11-21 ENCOUNTER — Ambulatory Visit (INDEPENDENT_AMBULATORY_CARE_PROVIDER_SITE_OTHER): Payer: Self-pay | Admitting: Infectious Diseases

## 2013-11-21 ENCOUNTER — Encounter: Payer: Self-pay | Admitting: Infectious Diseases

## 2013-11-21 VITALS — BP 136/87 | HR 85 | Temp 98.2°F | Wt 118.0 lb

## 2013-11-21 DIAGNOSIS — B20 Human immunodeficiency virus [HIV] disease: Secondary | ICD-10-CM

## 2013-11-21 DIAGNOSIS — Z113 Encounter for screening for infections with a predominantly sexual mode of transmission: Secondary | ICD-10-CM

## 2013-11-21 DIAGNOSIS — I10 Essential (primary) hypertension: Secondary | ICD-10-CM

## 2013-11-21 DIAGNOSIS — R87612 Low grade squamous intraepithelial lesion on cytologic smear of cervix (LGSIL): Secondary | ICD-10-CM

## 2013-11-21 DIAGNOSIS — Z720 Tobacco use: Secondary | ICD-10-CM

## 2013-11-21 DIAGNOSIS — Z23 Encounter for immunization: Secondary | ICD-10-CM

## 2013-11-21 NOTE — Assessment & Plan Note (Signed)
Will schedule her for PAP.

## 2013-11-21 NOTE — Assessment & Plan Note (Signed)
Doing well Off meds 
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
## 2013-11-21 NOTE — Assessment & Plan Note (Signed)
Encouraged her to quit smoking.  ?

## 2013-11-21 NOTE — Progress Notes (Signed)
   Subjective:    Patient ID: Christine Dawson, female    DOB: 02-02-1966, 48 y.o.   MRN: [id number]  HPI  47 yo F with HIV+, prev on Atripla then changed to ISN/TRV due to ADR. Had PAP 07-05-10 CIN 1, then cone bx 01-01-11. Last PAP NL (10-2012). Had detectable VL in December 2012 and abn genotype. She has resistance to all integrase inhibitors, no PIs and some NRTIs (epivir, DDI, ABC). HLA (-).  At previous visit was changed to CBV/TFV/ATVr.  Hep B S Ab (-) 2014. She restarted her hep B series 2015.    Has been doing well. Here because her mom has noticed her eyes are turning yellow.   HIV 1 RNA Quant (copies/mL)  Date Value  05/04/2013 <20   01/13/2013 <20   09/20/2012 <20      CD4 T Cell Abs (/uL)  Date Value  05/04/2013 390*  01/13/2013 200*  09/20/2012 270*   Review of Systems  Constitutional: Negative for appetite change and unexpected weight change.  Gastrointestinal: Negative for diarrhea and constipation.  Genitourinary: Negative for difficulty urinating.       Objective:   Physical Exam  Constitutional: She appears well-developed and well-nourished.  HENT:  Mouth/Throat: No oropharyngeal exudate.  Eyes: EOM are normal. Pupils are equal, round, and reactive to light. Scleral icterus is present.  Mild icterus.   Neck: Neck supple.  Cardiovascular: Normal rate, regular rhythm and normal heart sounds.   Pulmonary/Chest: Effort normal and breath sounds normal.  Abdominal: Soft. Bowel sounds are normal. There is no tenderness.  Lymphadenopathy:    She has no cervical adenopathy.          Assessment & Plan:

## 2013-11-22 LAB — RPR

## 2013-12-05 ENCOUNTER — Encounter: Payer: Self-pay | Admitting: Infectious Diseases

## 2013-12-05 ENCOUNTER — Ambulatory Visit (INDEPENDENT_AMBULATORY_CARE_PROVIDER_SITE_OTHER): Payer: Self-pay | Admitting: *Deleted

## 2013-12-05 VITALS — BP 138/75 | HR 68 | Temp 97.9°F | Resp 16 | Ht 64.5 in | Wt 119.5 lb

## 2013-12-05 DIAGNOSIS — B20 Human immunodeficiency virus [HIV] disease: Secondary | ICD-10-CM

## 2013-12-05 DIAGNOSIS — Z21 Asymptomatic human immunodeficiency virus [HIV] infection status: Secondary | ICD-10-CM

## 2013-12-05 DIAGNOSIS — Z006 Encounter for examination for normal comparison and control in clinical research program: Secondary | ICD-10-CM

## 2013-12-05 LAB — CBC WITH DIFFERENTIAL/PLATELET
Basophils Absolute: 0 10*3/uL (ref 0.0–0.1)
Basophils Relative: 1 % (ref 0–1)
Eosinophils Absolute: 0.1 10*3/uL (ref 0.0–0.7)
Eosinophils Relative: 2 % (ref 0–5)
HEMATOCRIT: 35.1 % — AB (ref 36.0–46.0)
HEMOGLOBIN: 11.8 g/dL — AB (ref 12.0–15.0)
LYMPHS PCT: 36 % (ref 12–46)
Lymphs Abs: 1.6 10*3/uL (ref 0.7–4.0)
MCH: 33.8 pg (ref 26.0–34.0)
MCHC: 33.6 g/dL (ref 30.0–36.0)
MCV: 100.6 fL — ABNORMAL HIGH (ref 78.0–100.0)
MONOS PCT: 7 % (ref 3–12)
Monocytes Absolute: 0.3 10*3/uL (ref 0.1–1.0)
NEUTROS ABS: 2.4 10*3/uL (ref 1.7–7.7)
NEUTROS PCT: 54 % (ref 43–77)
Platelets: 208 10*3/uL (ref 150–400)
RBC: 3.49 MIL/uL — AB (ref 3.87–5.11)
RDW: 15.9 % — ABNORMAL HIGH (ref 11.5–15.5)
WBC: 4.4 10*3/uL (ref 4.0–10.5)

## 2013-12-05 LAB — LIPID PANEL
Cholesterol: 153 mg/dL (ref 0–200)
HDL: 59 mg/dL (ref >39)
LDL CALC: 66 mg/dL (ref 0–99)
TRIGLYCERIDES: 141 mg/dL (ref <150)
Total CHOL/HDL Ratio: 2.6 Ratio
VLDL: 28 mg/dL (ref 0–40)

## 2013-12-05 LAB — COMPREHENSIVE METABOLIC PANEL
ALBUMIN: 4.4 g/dL (ref 3.5–5.2)
ALT: 14 U/L (ref 0–35)
AST: 21 U/L (ref 0–37)
Alkaline Phosphatase: 102 U/L (ref 39–117)
BUN: 13 mg/dL (ref 6–23)
CO2: 23 meq/L (ref 19–32)
Calcium: 9.1 mg/dL (ref 8.4–10.5)
Chloride: 104 mEq/L (ref 96–112)
Creat: 0.79 mg/dL (ref 0.50–1.10)
GLUCOSE: 81 mg/dL (ref 70–99)
Potassium: 4.8 mEq/L (ref 3.5–5.3)
Sodium: 138 mEq/L (ref 135–145)
Total Bilirubin: 0.6 mg/dL (ref 0.2–1.2)
Total Protein: 7.4 g/dL (ref 6.0–8.3)

## 2013-12-05 NOTE — Progress Notes (Signed)
   Subjective:    Patient ID: Christine Dawson, female    DOB: 03/23/1965, 48 y.o.   MRN: [id number]  HPI    Review of Systems  Constitutional: Negative.   HENT: Negative.   Eyes: Negative.   Respiratory: Positive for cough. Negative for shortness of breath.   Cardiovascular: Negative.   Gastrointestinal: Negative.   Genitourinary: Negative.   Musculoskeletal: Negative.   Skin: Negative.   Neurological: Positive for numbness.  Psychiatric/Behavioral: Negative.        Objective:   Physical Exam  Constitutional: She is oriented to person, place, and time.  HENT:  Mouth/Throat: Oropharynx is clear and moist.  Eyes: No scleral icterus.  Neck: Normal range of motion.  Cardiovascular: Normal rate, regular rhythm and normal heart sounds.   Pulmonary/Chest: Effort normal. She has wheezes.  Abdominal: Soft. Bowel sounds are normal.  Musculoskeletal: Normal range of motion. She exhibits no edema.  Lymphadenopathy:    She has no cervical adenopathy.  Neurological: She is alert and oriented to person, place, and time.  Skin: Skin is warm and dry.  Psychiatric: She has a normal mood and affect.          Assessment & Plan:  Christine Dawson is here to screen for the [employer] study. Informed consent was obtained after she read over the consent and we reviewed it together. She understands it is voluntary and she can quit at any time without penalty. The requirements and risks of the study were explained. On exam she has wheezed noted in her lower lungs. She smokes approximately 6-7 cigarettes a day and says she has a cough in the mornings. She does report that her fingertip[s will go numb on both hands from time to time. But denies any other problems. She was diagnosed in 1993 and is unsure of what meds she has taken before she came here to be seen. She does not recall ever having any OIS or being very sick with HIV. She has a history of using cocaine and meth, but denies any recent history or IV drug  use. She is not taking any other medications and has always been fairly healthy according to her. She had her tubes tied with her last delivery  in 1987. If she is eligible for study, she will return in 2 weeks for the entry visit.

## 2013-12-06 LAB — HIV-1 RNA QUANT-NO REFLEX-BLD

## 2013-12-06 LAB — T-HELPER CELL (CD4) - (RCID CLINIC ONLY)
CD4 T CELL ABS: 380 /uL — AB (ref 400–2700)
CD4 T CELL HELPER: 26 % — AB (ref 33–55)

## 2013-12-13 ENCOUNTER — Other Ambulatory Visit: Payer: Self-pay | Admitting: *Deleted

## 2013-12-13 DIAGNOSIS — B20 Human immunodeficiency virus [HIV] disease: Secondary | ICD-10-CM

## 2013-12-13 MED ORDER — ATAZANAVIR SULFATE 300 MG PO CAPS: ORAL_CAPSULE | ORAL | Status: DC

## 2013-12-13 MED ORDER — RITONAVIR 100 MG PO TABS: ORAL_TABLET | ORAL | Status: DC

## 2013-12-13 MED ORDER — TENOFOVIR DISOPROXIL FUMARATE 300 MG PO TABS: ORAL_TABLET | ORAL | Status: DC

## 2013-12-13 MED ORDER — LAMIVUDINE-ZIDOVUDINE 150-300 MG PO TABS: ORAL_TABLET | ORAL | Status: DC

## 2014-02-06 ENCOUNTER — Ambulatory Visit (HOSPITAL_COMMUNITY)
Admission: RE | Admit: 2014-02-06 | Discharge: 2014-02-06 | Disposition: A | Discharge status: Home / Self Care | Payer: Self-pay | Source: Ambulatory Visit | Attending: Infectious Diseases | Admitting: Infectious Diseases

## 2014-02-06 ENCOUNTER — Encounter: Payer: Self-pay | Admitting: Infectious Diseases

## 2014-02-06 ENCOUNTER — Ambulatory Visit (INDEPENDENT_AMBULATORY_CARE_PROVIDER_SITE_OTHER): Payer: Self-pay | Admitting: Infectious Diseases

## 2014-02-06 VITALS — BP 141/85 | HR 70 | Temp 98.0°F | Ht 62.0 in | Wt 117.0 lb

## 2014-02-06 DIAGNOSIS — F17210 Nicotine dependence, cigarettes, uncomplicated: Secondary | ICD-10-CM | POA: Insufficient documentation

## 2014-02-06 DIAGNOSIS — R05 Cough: Secondary | ICD-10-CM | POA: Insufficient documentation

## 2014-02-06 DIAGNOSIS — R079 Chest pain, unspecified: Secondary | ICD-10-CM

## 2014-02-06 DIAGNOSIS — B20 Human immunodeficiency virus [HIV] disease: Secondary | ICD-10-CM

## 2014-02-06 NOTE — Assessment & Plan Note (Signed)
Appears to be doing well on complicated salvage rx. Will continue. Needs to complete 2nd Hep B series.  Offered/refuses condoms.

## 2014-02-06 NOTE — Progress Notes (Signed)
   Subjective:    Patient ID: Christine Dawson, female    DOB: Feb 27, 1965, 49 y.o.   MRN: [id number]  HPI 49yo F with HIV+, prev on Atripla then changed to ISN/TRV due to ADR. Had PAP 07-05-10 CIN 1, then cone bx 01-01-11. Last PAP NL (10-2012). Had detectable VL in December 2012 and abn genotype. She has resistance to all integrase inhibitors, no PIs and some NRTIs (epivir, DDI, ABC). HLA (-).  At previous visit was changed to CBV/TFV/ATVr.  Hep B S Ab (-) 2014. She restarted her hep B series 2015.   Comes to clinic today with pain in R armpit to mid-abd. Has had for 1 week. No change with eating, worse with deep breathing. Occas cough. Pain worse with coughing. No f/c.  No problems with ART.   HIV 1 RNA QUANT (copies/mL)  Date Value  12/05/2013 <20  05/04/2013 <20  01/13/2013 <20   CD4 T CELL ABS (/uL)  Date Value  12/05/2013 380*  05/04/2013 390*  01/13/2013 200*    Review of Systems  Constitutional: Negative for appetite change and unexpected weight change.  Respiratory: Positive for cough.   Cardiovascular: Positive for chest pain.  Gastrointestinal: Positive for abdominal pain. Negative for diarrhea and constipation.  Genitourinary: Negative for difficulty urinating.   Has noted no breast masses.     Objective:   Physical Exam  Constitutional: She appears well-developed and well-nourished.  Eyes: EOM are normal. Pupils are equal, round, and reactive to light.  Neck: Neck supple.  Cardiovascular: Normal rate, regular rhythm and normal heart sounds.   Pulmonary/Chest: Effort normal and breath sounds normal.  Abdominal: Soft. Bowel sounds are normal. She exhibits no distension. There is no tenderness.  Lymphadenopathy:    She has no cervical adenopathy.    She has no axillary adenopathy.          Assessment & Plan:

## 2014-02-06 NOTE — Assessment & Plan Note (Addendum)
Etiology is unclear. She seems to have musculoskeletal strain. Does not have PE risk factors (no recent air travel, not on OCPs). Will check CXR, consider RUQ u/s if not improved. Murphy's sign (-).  Check d-dimer.  Also needs mammogram.

## 2014-02-07 LAB — D-DIMER, QUANTITATIVE (NOT AT ARMC): D DIMER QUANT: 0.29 ug{FEU}/mL (ref 0.00–0.48)

## 2014-02-22 NOTE — Addendum Note (Signed)
Addended by: Jennet MaduroESTRIDGE, DENISE D on: 02/22/2014 09:28 AM   Modules accepted: Orders

## 2014-03-06 ENCOUNTER — Ambulatory Visit: Payer: Self-pay | Admitting: Infectious Diseases

## 2014-03-14 ENCOUNTER — Ambulatory Visit: Payer: Self-pay | Admitting: Infectious Diseases

## 2014-05-23 ENCOUNTER — Ambulatory Visit: Payer: Self-pay | Admitting: Internal Medicine

## 2014-08-17 ENCOUNTER — Other Ambulatory Visit: Payer: Self-pay | Admitting: Infectious Diseases

## 2014-08-17 DIAGNOSIS — B20 Human immunodeficiency virus [HIV] disease: Secondary | ICD-10-CM

## 2014-08-17 MED ORDER — LAMIVUDINE-ZIDOVUDINE 150-300 MG PO TABS: ORAL_TABLET | ORAL | Status: DC

## 2014-09-14 ENCOUNTER — Other Ambulatory Visit: Payer: Self-pay

## 2014-09-14 ENCOUNTER — Other Ambulatory Visit: Payer: Self-pay | Admitting: Infectious Diseases

## 2014-09-14 DIAGNOSIS — B20 Human immunodeficiency virus [HIV] disease: Secondary | ICD-10-CM

## 2014-09-14 LAB — CBC WITH DIFFERENTIAL/PLATELET
BASOS ABS: 0.1 10*3/uL (ref 0.0–0.1)
BASOS PCT: 1 % (ref 0–1)
Eosinophils Absolute: 0.1 10*3/uL (ref 0.0–0.7)
Eosinophils Relative: 2 % (ref 0–5)
HEMATOCRIT: 37.5 % (ref 36.0–46.0)
Hemoglobin: 12.2 g/dL (ref 12.0–15.0)
Lymphocytes Relative: 40 % (ref 12–46)
Lymphs Abs: 2.1 10*3/uL (ref 0.7–4.0)
MCH: 33.3 pg (ref 26.0–34.0)
MCHC: 32.5 g/dL (ref 30.0–36.0)
MCV: 102.5 fL — ABNORMAL HIGH (ref 78.0–100.0)
MPV: 9.6 fL (ref 8.6–12.4)
Monocytes Absolute: 0.4 10*3/uL (ref 0.1–1.0)
Monocytes Relative: 8 % (ref 3–12)
NEUTROS ABS: 2.5 10*3/uL (ref 1.7–7.7)
Neutrophils Relative %: 49 % (ref 43–77)
PLATELETS: 212 10*3/uL (ref 150–400)
RBC: 3.66 MIL/uL — ABNORMAL LOW (ref 3.87–5.11)
RDW: 15.4 % (ref 11.5–15.5)
WBC: 5.2 10*3/uL (ref 4.0–10.5)

## 2014-09-14 LAB — COMPREHENSIVE METABOLIC PANEL
ALK PHOS: 81 U/L (ref 33–130)
ALT: 7 U/L (ref 6–29)
AST: 15 U/L (ref 10–35)
Albumin: 4.2 g/dL (ref 3.6–5.1)
BILIRUBIN TOTAL: 4.5 mg/dL — AB (ref 0.2–1.2)
BUN: 9 mg/dL (ref 7–25)
CO2: 26 mmol/L (ref 20–31)
CREATININE: 0.93 mg/dL (ref 0.50–1.05)
Calcium: 9.5 mg/dL (ref 8.6–10.4)
Chloride: 104 mmol/L (ref 98–110)
GLUCOSE: 155 mg/dL — AB (ref 65–99)
POTASSIUM: 4.6 mmol/L (ref 3.5–5.3)
Sodium: 140 mmol/L (ref 135–146)
TOTAL PROTEIN: 7.4 g/dL (ref 6.1–8.1)

## 2014-09-14 NOTE — Addendum Note (Signed)
Addended by: Mariea Clonts D on: 09/14/2014 04:25 PM   Modules accepted: Orders

## 2014-09-14 NOTE — Addendum Note (Signed)
Addended by: Mariea Clonts D on: 09/14/2014 04:28 PM   Modules accepted: Orders

## 2014-09-15 LAB — T-HELPER CELL (CD4) - (RCID CLINIC ONLY)
CD4 T CELL HELPER: 26 % — AB (ref 33–55)
CD4 T Cell Abs: 540 /uL (ref 400–2700)

## 2014-09-18 LAB — HIV-1 RNA QUANT-NO REFLEX-BLD
HIV 1 RNA Quant: <20 copies/mL (ref <20)
HIV-1 RNA Quant, Log: <1.3 {Log} (ref <1.30)

## 2014-10-11 ENCOUNTER — Other Ambulatory Visit: Payer: Self-pay | Admitting: Obstetrics and Gynecology

## 2014-11-01 NOTE — Progress Notes (Signed)
Approval faxed to [facility]. Peightyn Roberson M, RN  

## 2014-11-13 ENCOUNTER — Ambulatory Visit: Payer: Self-pay | Admitting: Infectious Diseases

## 2014-11-20 ENCOUNTER — Other Ambulatory Visit: Payer: Self-pay | Admitting: Obstetrics and Gynecology

## 2014-12-12 ENCOUNTER — Other Ambulatory Visit: Payer: Self-pay | Admitting: Obstetrics and Gynecology

## 2015-02-07 ENCOUNTER — Other Ambulatory Visit: Payer: Self-pay | Admitting: Obstetrics and Gynecology

## 2015-03-21 ENCOUNTER — Other Ambulatory Visit: Payer: Self-pay | Admitting: Infectious Diseases

## 2015-03-21 ENCOUNTER — Other Ambulatory Visit: Payer: Self-pay | Admitting: Obstetrics and Gynecology

## 2015-03-21 DIAGNOSIS — B20 Human immunodeficiency virus [HIV] disease: Secondary | ICD-10-CM

## 2015-03-21 MED ORDER — LAMIVUDINE-ZIDOVUDINE 150-300 MG PO TABS: ORAL_TABLET | ORAL | Status: DC

## 2015-03-21 NOTE — Telephone Encounter (Signed)
Pt must keep appointment with Dr. Ninetta Lights on Monday, March 6 at 8:45 AM.  Thank you.

## 2015-03-26 ENCOUNTER — Other Ambulatory Visit: Payer: Self-pay

## 2015-03-26 ENCOUNTER — Other Ambulatory Visit: Payer: Self-pay | Admitting: Licensed Clinical Social Worker

## 2015-03-26 DIAGNOSIS — B20 Human immunodeficiency virus [HIV] disease: Secondary | ICD-10-CM

## 2015-04-09 ENCOUNTER — Ambulatory Visit: Payer: Self-pay

## 2015-04-09 ENCOUNTER — Ambulatory Visit (INDEPENDENT_AMBULATORY_CARE_PROVIDER_SITE_OTHER): Payer: Self-pay | Admitting: Infectious Diseases

## 2015-04-09 ENCOUNTER — Encounter: Payer: Self-pay | Admitting: Infectious Diseases

## 2015-04-09 VITALS — BP 146/80 | HR 89 | Temp 98.2°F | Ht 63.0 in | Wt 129.5 lb

## 2015-04-09 DIAGNOSIS — B20 Human immunodeficiency virus [HIV] disease: Secondary | ICD-10-CM

## 2015-04-09 DIAGNOSIS — Z79899 Other long term (current) drug therapy: Secondary | ICD-10-CM

## 2015-04-09 DIAGNOSIS — R079 Chest pain, unspecified: Secondary | ICD-10-CM

## 2015-04-09 DIAGNOSIS — Z113 Encounter for screening for infections with a predominantly sexual mode of transmission: Secondary | ICD-10-CM

## 2015-04-09 DIAGNOSIS — R87612 Low grade squamous intraepithelial lesion on cytologic smear of cervix (LGSIL): Secondary | ICD-10-CM

## 2015-04-09 DIAGNOSIS — Z23 Encounter for immunization: Secondary | ICD-10-CM

## 2015-04-09 DIAGNOSIS — A539 Syphilis, unspecified: Secondary | ICD-10-CM

## 2015-04-09 DIAGNOSIS — Z720 Tobacco use: Secondary | ICD-10-CM

## 2015-04-09 LAB — COMPREHENSIVE METABOLIC PANEL
ALK PHOS: 82 U/L (ref 33–130)
ALT: 6 U/L (ref 6–29)
AST: 13 U/L (ref 10–35)
Albumin: 3.7 g/dL (ref 3.6–5.1)
BUN: 14 mg/dL (ref 7–25)
CO2: 24 mmol/L (ref 20–31)
Calcium: 8.9 mg/dL (ref 8.6–10.4)
Chloride: 105 mmol/L (ref 98–110)
Creat: 0.83 mg/dL (ref 0.50–1.05)
GLUCOSE: 81 mg/dL (ref 65–99)
POTASSIUM: 4.4 mmol/L (ref 3.5–5.3)
Sodium: 139 mmol/L (ref 135–146)
Total Bilirubin: 0.4 mg/dL (ref 0.2–1.2)
Total Protein: 6.8 g/dL (ref 6.1–8.1)

## 2015-04-09 LAB — CBC
HCT: 36 % (ref 36.0–46.0)
Hemoglobin: 11.8 g/dL — ABNORMAL LOW (ref 12.0–15.0)
MCH: 33.1 pg (ref 26.0–34.0)
MCHC: 32.8 g/dL (ref 30.0–36.0)
MCV: 101.1 fL — AB (ref 78.0–100.0)
MPV: 9.3 fL (ref 8.6–12.4)
PLATELETS: 227 10*3/uL (ref 150–400)
RBC: 3.56 MIL/uL — ABNORMAL LOW (ref 3.87–5.11)
RDW: 15.8 % — AB (ref 11.5–15.5)
WBC: 4.5 10*3/uL (ref 4.0–10.5)

## 2015-04-09 LAB — LIPID PANEL
CHOL/HDL RATIO: 2.5 ratio (ref <=5.0)
Cholesterol: 143 mg/dL (ref 125–200)
HDL: 57 mg/dL (ref >=46)
LDL CALC: 64 mg/dL (ref <130)
Triglycerides: 111 mg/dL (ref <150)
VLDL: 22 mg/dL (ref <30)

## 2015-04-09 MED ORDER — ATAZANAVIR-COBICISTAT 300-150 MG PO TABS: 1.0000 | ORAL_TABLET | Freq: Every day | ORAL | Status: DC

## 2015-04-09 NOTE — Assessment & Plan Note (Signed)
Encouraged to quit. 

## 2015-04-09 NOTE — Addendum Note (Signed)
Addended by: Mariea ClontsGREEN, Gracelynn Bircher D on: 04/09/2015 12:01 PM   Modules accepted: Orders

## 2015-04-09 NOTE — Assessment & Plan Note (Signed)
Repeat PAP on 3-13

## 2015-04-09 NOTE — Assessment & Plan Note (Signed)
Will recheck her RPR today

## 2015-04-09 NOTE — Assessment & Plan Note (Signed)
resolved 

## 2015-04-09 NOTE — Addendum Note (Signed)
Addended by: Jennet MaduroESTRIDGE, Samanda Buske D on: 04/09/2015 10:08 AM   Modules accepted: Orders

## 2015-04-09 NOTE — Addendum Note (Signed)
Addended by: Guillermina Shaft C on: 04/09/2015 09:10 AM   Modules accepted: Orders, Medications

## 2015-04-09 NOTE — Progress Notes (Signed)
   Subjective:    Patient ID: Christine Dawson, female    DOB: 1966-01-01, 50 y.o.   MRN: [id number]  HPI 50 yo F with HIV+, prev on Atripla then changed to ISN/TRV due to ADR. Had PAP 07-05-10 CIN 1, then cone bx 01-01-11. Last PAP NL (10-2012). Will see Langley Gauss on 04-16-15.  Had detectable VL in December 2012 and abn genotype. She has resistance to all integrase inhibitors, no PIs and some NRTIs (epivir, DDI, ABC). HLA (-).  At previous visit was changed to CBV/TFV/ATVr.  Hep B S Ab (-) 2014. She restarted her hep B series 2015.   At her prev visit was c/o R sided CP. Since resolved. Continues to smoke.  Gets flu shot today.   Review of Systems  Constitutional: Negative for appetite change and unexpected weight change.  Respiratory: Positive for cough. Negative for shortness of breath.   Cardiovascular: Negative for chest pain.  Gastrointestinal: Negative for diarrhea and constipation.  Genitourinary: Negative for difficulty urinating and menstrual problem.  Neurological: Negative for headaches.       Objective:   Physical Exam  Constitutional: She appears well-developed and well-nourished.  HENT:  Mouth/Throat: No oropharyngeal exudate.  Eyes: EOM are normal. Pupils are equal, round, and reactive to light.  Neck: Neck supple.  Cardiovascular: Normal rate, regular rhythm and normal heart sounds.   Pulmonary/Chest: Effort normal and breath sounds normal.  Abdominal: Soft. Bowel sounds are normal. There is no tenderness. There is no rebound.  Musculoskeletal: She exhibits no edema.  Lymphadenopathy:    She has no cervical adenopathy.       Assessment & Plan:

## 2015-04-09 NOTE — Assessment & Plan Note (Signed)
Will change her ATVr to ATVc to decrease her pills Offered/refused condoms.  Gets flu shot today Needs to restart Hep B series Will check her labs today Will see her back in 4 months

## 2015-04-10 LAB — RPR

## 2015-04-10 LAB — T-HELPER CELL (CD4) - (RCID CLINIC ONLY)
CD4 % Helper T Cell: 25 % — ABNORMAL LOW (ref 33–55)
CD4 T Cell Abs: 370 /uL — ABNORMAL LOW (ref 400–2700)

## 2015-04-10 LAB — HIV-1 RNA QUANT-NO REFLEX-BLD

## 2015-04-16 ENCOUNTER — Ambulatory Visit: Payer: Self-pay

## 2015-04-30 ENCOUNTER — Other Ambulatory Visit: Payer: Self-pay | Admitting: Infectious Diseases

## 2015-04-30 ENCOUNTER — Other Ambulatory Visit: Payer: Self-pay | Admitting: Obstetrics and Gynecology

## 2015-04-30 DIAGNOSIS — B20 Human immunodeficiency virus [HIV] disease: Secondary | ICD-10-CM

## 2015-05-01 ENCOUNTER — Other Ambulatory Visit: Payer: Self-pay | Admitting: *Deleted

## 2015-05-01 DIAGNOSIS — B20 Human immunodeficiency virus [HIV] disease: Secondary | ICD-10-CM

## 2015-05-01 MED ORDER — ATAZANAVIR-COBICISTAT 300-150 MG PO TABS: 1.0000 | ORAL_TABLET | Freq: Every day | ORAL | Status: DC

## 2015-06-11 ENCOUNTER — Other Ambulatory Visit: Payer: Self-pay | Admitting: Obstetrics and Gynecology

## 2015-06-18 ENCOUNTER — Telehealth: Payer: Self-pay | Admitting: *Deleted

## 2015-06-18 NOTE — Telephone Encounter (Signed)
Patient called stating, "the white and pink medicine she is taking is causing nausea" and requesting an appointment. She gets off of work at 2:45 pm, however the only appt available for Dr. Ninetta LightsHatcher this week is 06/21/15 at 2:45 pm. Asked how soon she could be here and she said 3:00 pm. Advised her to keep this appt and if she is going to be any later to please call and notify the front desk staff. She agreed to this plan.

## 2015-06-21 ENCOUNTER — Ambulatory Visit: Payer: Self-pay | Admitting: Infectious Diseases

## 2015-06-25 ENCOUNTER — Telehealth: Payer: Self-pay | Admitting: Pharmacist

## 2015-06-25 ENCOUNTER — Telehealth: Payer: Self-pay | Admitting: *Deleted

## 2015-06-25 NOTE — Telephone Encounter (Signed)
Received a return call from Ms. Cordone. She states "my new medicines do not agree with me".  However from what she describes over the phone I am concerned that she may not be taking her new regimen as it is prescribed.  I offered her a pharmacy clinic appointment to review all of her medications and she agreed. She is scheduled to see Minh at 3:30 on Tuesday 06/26/15.  Instructed her to bring all of her medications with her for review.   Estella HuskMichelle Darely Becknell, Pharm.D, BCPS, AAHIVP Clinical Pharmacist [facility] 06/25/2015, 3:43 PM

## 2015-06-25 NOTE — Telephone Encounter (Signed)
Patient calling for advice, wants to go back to her old regimen. Patient is not sure of the names, describes them by color. She reports side effects ("mainly nausea").  RN attempted to transfer to [facility] in pharmacy, but patient dropped the call.  [name], Chaze Hruska M, RN

## 2015-06-25 NOTE — Telephone Encounter (Signed)
Received a phone call from Micael HampshireMichelle Howell, RN regarding Ms. Belflower. She called with issues related to her recent medication changes. While waiting for the call to be transferred to me the call was disconnected on Ms. Coghlan's end.  I called her back at the number listed in the chart with no answer. I left a generic message on her voicemail requesting a return call.

## 2015-06-26 ENCOUNTER — Ambulatory Visit: Payer: Self-pay

## 2015-06-27 ENCOUNTER — Ambulatory Visit: Payer: Self-pay

## 2015-06-27 ENCOUNTER — Ambulatory Visit: Payer: Self-pay | Admitting: Pharmacist Clinician (PhC)/ Clinical Pharmacy Specialist

## 2015-06-27 DIAGNOSIS — B20 Human immunodeficiency virus [HIV] disease: Secondary | ICD-10-CM

## 2015-06-27 MED ORDER — DARUNAVIR-COBICISTAT 800-150 MG PO TABS: 1.0000 | ORAL_TABLET | Freq: Every day | ORAL | Status: DC

## 2015-06-27 MED ORDER — EMTRICITAB-RILPIVIR-TENOFOV AF 200-25-25 MG PO TABS: 1.0000 | ORAL_TABLET | Freq: Every day | ORAL | Status: DC

## 2015-06-27 NOTE — Progress Notes (Signed)
Patient ID: Christine Dawson, female   DOB: 1965-12-29, 50 y.o.   MRN: [id number] HPI: Christine Dawson is a 50 y.o. female who is here to f/u with pharmacy about some issues with her ART.   Allergies: No Known Allergies  Vitals:    Past Medical History: Past Medical History  Diagnosis Date  . Abnormal Pap smear 08/2010  . HIV infection [facility])     Social History: Social History   Social History  . Marital Status: Married    Spouse Name: N/A  . Number of Children: N/A  . Years of Education: N/A   Social History Main Topics  . Smoking status: Current Every Day Smoker -- 0.50 packs/day    Types: Cigarettes  . Smokeless tobacco: Never Used     Comment: cutting back  . Alcohol Use: 12.6 oz/week    21 Standard drinks or equivalent per week     Comment: beer  . Drug Use: No  . Sexual Activity: No     Comment: refused condom   Other Topics Concern  . Not on file   Social History Narrative    Previous Regimen: ATP, ISN/TRV  Current Regimen: Evotaz + Combivir + TDF  Labs: HIV 1 RNA QUANT (copies/mL)  Date Value  04/09/2015 <20  09/14/2014 <20  12/05/2013 <20   CD4 T CELL ABS (/uL)  Date Value  04/09/2015 370*  09/14/2014 540  12/05/2013 380*   HEP B S AB (no units)  Date Value  01/13/2013 NEG   HEPATITIS B SURFACE AG (no units)  Date Value  06/12/2010 NEGATIVE   HCV AB (no units)  Date Value  01/23/2011 NEGATIVE    CrCl: CrCl cannot be calculated (Unknown ideal weight.).  Lipids:    Component Value Date/Time   CHOL 143 04/09/2015 0923   TRIG 111 04/09/2015 0923   HDL 57 04/09/2015 0923   CHOLHDL 2.5 04/09/2015 0923   VLDL 22 04/09/2015 0923   LDLCALC 64 04/09/2015 0923   HIV Genotype Composite Data Genotype Dates:   Mutations in Bold impact drug susceptibility RT Mutations M184V  PI Mutations None  Integrase Mutations G140S, Q148H   Interpretation of Genotype Data per Stanford HIV Database Nucleoside RTIs  abacavir (ABC) Low-Level  Resistance zidovudine (AZT) Susceptible emtricitabine (FTC) High-Level Resistance lamivudine (3TC) High-Level Resistance tenofovir (TDF) Susceptible   Non-Nucleoside RTIs  efavirenz (EFV) Susceptible etravirine (ETR) Susceptible nevirapine (NVP) Susceptible rilpivirine (RPV) Susceptible   Protease Inhibitors  atazanavir/r (ATV/r) Susceptible darunavir/r (DRV/r) Susceptible lopinavir/r (LPV/r) Susceptible   Integrase Inhibitors  dolutegravir (DTG) Intermediate Resistance elvitegravir (EVG) High-Level Resistance raltegravir (RAL) High-Level Resistance    Assessment:  Keryn' ART was recently changed from ATV/r to [employer].  She called the other about the chronic tachycardia and insomnia since the change. Brought her in today to see if we can change her regimen around. She has harbored quite a bit of resistance including integrase. Her virus is suppressed on the current regimen other than the adverse effects issues. She combined the TDF and Combivir in the same prescription bottle. At first, I thought she was taking it incorrectly but she is taking it appropriately. After reviewing her profile (see above), we can simplify it to [facility] and Prezcobix. This would give her 3 active drugs.   Recommendations:  Dc CBV/TDF/Evotaz Start Prezcobix 1 PO qday with meal Start Odefsey 1 PO qday with meal F/u with Dr. Ninetta Lights in June  Clide Cliff, PharmD Clinical Infectious Disease Pharmacist [facility]  [facility] 06/27/2015, 3:55 PM

## 2015-07-16 ENCOUNTER — Ambulatory Visit: Payer: Self-pay | Admitting: Infectious Diseases

## 2015-07-27 ENCOUNTER — Other Ambulatory Visit: Payer: Self-pay | Admitting: Obstetrics and Gynecology

## 2015-07-30 ENCOUNTER — Telehealth: Payer: Self-pay | Admitting: *Deleted

## 2015-07-30 NOTE — Telephone Encounter (Signed)
Patient walked into clinic requesting that a form be filled out by Dr. Ninetta LightsHatcher for disability. Explained that our MDs do not do this and that she needed a primary care doctor, so that she can be assessed. Asked her to wait and I would schedule her an appt with [facility] (she said she was interested). Came back five minutes later and patient had left the office. Mailed her the appt info for [facility] [place] on 09/04/15. Wendall MolaJacqueline Cockerham

## 2015-07-31 ENCOUNTER — Encounter: Payer: Self-pay | Admitting: Infectious Diseases

## 2015-09-04 ENCOUNTER — Ambulatory Visit: Payer: Self-pay | Admitting: Family Medicine

## 2015-10-25 ENCOUNTER — Other Ambulatory Visit: Payer: Self-pay | Admitting: Obstetrics and Gynecology

## 2015-10-26 ENCOUNTER — Other Ambulatory Visit: Payer: Self-pay | Admitting: Obstetrics and Gynecology

## 2015-11-14 ENCOUNTER — Encounter: Payer: Self-pay | Admitting: Infectious Diseases

## 2015-12-26 ENCOUNTER — Other Ambulatory Visit: Payer: Self-pay | Admitting: Obstetrics and Gynecology

## 2016-01-24 ENCOUNTER — Other Ambulatory Visit (INDEPENDENT_AMBULATORY_CARE_PROVIDER_SITE_OTHER): Payer: Self-pay

## 2016-01-24 DIAGNOSIS — B20 Human immunodeficiency virus [HIV] disease: Secondary | ICD-10-CM

## 2016-01-24 LAB — CBC WITH DIFFERENTIAL/PLATELET
Basophils Absolute: 51 cells/uL (ref 0–200)
Basophils Relative: 1 %
Eosinophils Absolute: 153 cells/uL (ref 15–500)
Eosinophils Relative: 3 %
HEMATOCRIT: 37 % (ref 35.0–45.0)
HEMOGLOBIN: 12 g/dL (ref 11.7–15.5)
LYMPHS ABS: 1785 {cells}/uL (ref 850–3900)
Lymphocytes Relative: 35 %
MCH: 30.6 pg (ref 27.0–33.0)
MCHC: 32.4 g/dL (ref 32.0–36.0)
MCV: 94.4 fL (ref 80.0–100.0)
MONO ABS: 357 {cells}/uL (ref 200–950)
MPV: 9.4 fL (ref 7.5–12.5)
Monocytes Relative: 7 %
NEUTROS PCT: 54 %
Neutro Abs: 2754 cells/uL (ref 1500–7800)
Platelets: 229 10*3/uL (ref 140–400)
RBC: 3.92 MIL/uL (ref 3.80–5.10)
RDW: 14.8 % (ref 11.0–15.0)
WBC: 5.1 10*3/uL (ref 3.8–10.8)

## 2016-01-25 LAB — T-HELPER CELL (CD4) - (RCID CLINIC ONLY)
CD4 T CELL HELPER: 26 % — AB (ref 33–55)
CD4 T Cell Abs: 520 /uL (ref 400–2700)

## 2016-01-25 LAB — COMPLETE METABOLIC PANEL WITH GFR
ALBUMIN: 4 g/dL (ref 3.6–5.1)
ALK PHOS: 75 U/L (ref 33–130)
ALT: 7 U/L (ref 6–29)
AST: 20 U/L (ref 10–35)
BILIRUBIN TOTAL: 0.4 mg/dL (ref 0.2–1.2)
BUN: 16 mg/dL (ref 7–25)
CALCIUM: 8.9 mg/dL (ref 8.6–10.4)
CO2: 18 mmol/L — ABNORMAL LOW (ref 20–31)
CREATININE: 0.95 mg/dL (ref 0.50–1.05)
Chloride: 104 mmol/L (ref 98–110)
GFR, Est African American: 80 mL/min (ref >=60)
GFR, Est Non African American: 69 mL/min (ref >=60)
Glucose, Bld: 111 mg/dL — ABNORMAL HIGH (ref 65–99)
Potassium: 4.8 mmol/L (ref 3.5–5.3)
Sodium: 137 mmol/L (ref 135–146)
TOTAL PROTEIN: 7.4 g/dL (ref 6.1–8.1)

## 2016-01-29 LAB — HIV-1 RNA QUANT-NO REFLEX-BLD: HIV 1 RNA Quant: <20 copies/mL (ref <20)

## 2016-02-11 ENCOUNTER — Ambulatory Visit: Payer: Self-pay | Admitting: Infectious Diseases

## 2016-02-13 ENCOUNTER — Ambulatory Visit: Payer: Self-pay | Admitting: Infectious Diseases

## 2016-02-20 ENCOUNTER — Other Ambulatory Visit: Payer: Self-pay | Admitting: Infectious Diseases

## 2016-03-25 ENCOUNTER — Other Ambulatory Visit: Payer: Self-pay | Admitting: Infectious Diseases

## 2016-03-27 ENCOUNTER — Other Ambulatory Visit: Payer: Self-pay | Admitting: Infectious Diseases

## 2016-04-30 ENCOUNTER — Encounter: Payer: Self-pay | Admitting: Infectious Diseases

## 2016-05-28 ENCOUNTER — Other Ambulatory Visit: Payer: Self-pay | Admitting: Internal Medicine

## 2016-05-28 ENCOUNTER — Other Ambulatory Visit: Payer: Self-pay | Admitting: Infectious Diseases

## 2016-05-28 DIAGNOSIS — B20 Human immunodeficiency virus [HIV] disease: Secondary | ICD-10-CM

## 2016-05-28 MED ORDER — DARUNAVIR-COBICISTAT 800-150 MG PO TABS: ORAL_TABLET | ORAL | 0 refills | Status: DC

## 2016-05-28 NOTE — Addendum Note (Signed)
Addended by: Jennet Maduro D on: 05/28/2016 02:59 PM   Modules accepted: Orders

## 2016-06-16 ENCOUNTER — Other Ambulatory Visit (INDEPENDENT_AMBULATORY_CARE_PROVIDER_SITE_OTHER): Payer: Self-pay

## 2016-06-16 DIAGNOSIS — Z79899 Other long term (current) drug therapy: Secondary | ICD-10-CM

## 2016-06-16 DIAGNOSIS — B20 Human immunodeficiency virus [HIV] disease: Secondary | ICD-10-CM

## 2016-06-16 DIAGNOSIS — Z113 Encounter for screening for infections with a predominantly sexual mode of transmission: Secondary | ICD-10-CM

## 2016-06-16 LAB — CBC WITH DIFFERENTIAL/PLATELET
Basophils Absolute: 52 cells/uL (ref 0–200)
Basophils Relative: 1 %
EOS ABS: 156 {cells}/uL (ref 15–500)
EOS PCT: 3 %
HCT: 39.6 % (ref 35.0–45.0)
HEMOGLOBIN: 12.7 g/dL (ref 11.7–15.5)
LYMPHS ABS: 2392 {cells}/uL (ref 850–3900)
Lymphocytes Relative: 46 %
MCH: 30.4 pg (ref 27.0–33.0)
MCHC: 32.1 g/dL (ref 32.0–36.0)
MCV: 94.7 fL (ref 80.0–100.0)
MONO ABS: 468 {cells}/uL (ref 200–950)
MONOS PCT: 9 %
MPV: 9.3 fL (ref 7.5–12.5)
NEUTROS ABS: 2132 {cells}/uL (ref 1500–7800)
Neutrophils Relative %: 41 %
PLATELETS: 243 10*3/uL (ref 140–400)
RBC: 4.18 MIL/uL (ref 3.80–5.10)
RDW: 14.5 % (ref 11.0–15.0)
WBC: 5.2 10*3/uL (ref 3.8–10.8)

## 2016-06-17 LAB — COMPREHENSIVE METABOLIC PANEL
ALT: 7 U/L (ref 6–29)
AST: 17 U/L (ref 10–35)
Albumin: 4.4 g/dL (ref 3.6–5.1)
Alkaline Phosphatase: 58 U/L (ref 33–130)
BILIRUBIN TOTAL: 0.7 mg/dL (ref 0.2–1.2)
BUN: 13 mg/dL (ref 7–25)
CHLORIDE: 107 mmol/L (ref 98–110)
CO2: 23 mmol/L (ref 20–31)
CREATININE: 0.93 mg/dL (ref 0.50–1.05)
Calcium: 9.3 mg/dL (ref 8.6–10.4)
Glucose, Bld: 86 mg/dL (ref 65–99)
Potassium: 4.9 mmol/L (ref 3.5–5.3)
SODIUM: 139 mmol/L (ref 135–146)
Total Protein: 7.5 g/dL (ref 6.1–8.1)

## 2016-06-17 LAB — RPR

## 2016-06-17 LAB — LIPID PANEL
Cholesterol: 164 mg/dL (ref <200)
HDL: 66 mg/dL (ref >50)
LDL CALC: 56 mg/dL (ref <100)
Total CHOL/HDL Ratio: 2.5 Ratio (ref <5.0)
Triglycerides: 208 mg/dL — ABNORMAL HIGH (ref <150)
VLDL: 42 mg/dL — AB (ref <30)

## 2016-06-17 LAB — URINE CYTOLOGY ANCILLARY ONLY
Chlamydia: NEGATIVE
NEISSERIA GONORRHEA: NEGATIVE

## 2016-06-17 LAB — T-HELPER CELL (CD4) - (RCID CLINIC ONLY)
CD4 % Helper T Cell: 23 % — ABNORMAL LOW (ref 33–55)
CD4 T Cell Abs: 600 /uL (ref 400–2700)

## 2016-06-18 LAB — HIV-1 RNA QUANT-NO REFLEX-BLD
HIV 1 RNA Quant: <20 copies/mL — AB
HIV-1 RNA Quant, Log: <1.3 Log copies/mL — AB

## 2016-07-01 ENCOUNTER — Ambulatory Visit: Payer: Self-pay | Admitting: Infectious Diseases

## 2016-07-11 ENCOUNTER — Other Ambulatory Visit: Payer: Self-pay | Admitting: Infectious Diseases

## 2016-07-11 DIAGNOSIS — B20 Human immunodeficiency virus [HIV] disease: Secondary | ICD-10-CM

## 2016-07-23 ENCOUNTER — Ambulatory Visit: Payer: Self-pay | Admitting: Infectious Diseases

## 2016-09-10 ENCOUNTER — Ambulatory Visit: Payer: Self-pay | Admitting: Infectious Diseases

## 2016-11-21 ENCOUNTER — Telehealth: Payer: Self-pay | Admitting: *Deleted

## 2016-11-21 NOTE — Telephone Encounter (Signed)
[facility]. Also stated no medication delivered since 05/2016. Advised to have patient call to make an appointment with our pharmacist. Last seen 04/2015.

## 2016-12-29 ENCOUNTER — Telehealth: Payer: Self-pay | Admitting: *Deleted

## 2016-12-29 NOTE — Telephone Encounter (Signed)
Voice mail left regarding appointment. Called back and had to leave a voice mail requesting a call back.

## 2017-01-12 ENCOUNTER — Other Ambulatory Visit: Payer: Self-pay

## 2017-01-21 ENCOUNTER — Ambulatory Visit: Payer: Self-pay | Admitting: Infectious Diseases

## 2017-02-04 ENCOUNTER — Ambulatory Visit (INDEPENDENT_AMBULATORY_CARE_PROVIDER_SITE_OTHER): Payer: Self-pay | Admitting: Pharmacist

## 2017-02-04 DIAGNOSIS — B20 Human immunodeficiency virus [HIV] disease: Secondary | ICD-10-CM

## 2017-02-04 DIAGNOSIS — Z23 Encounter for immunization: Secondary | ICD-10-CM

## 2017-02-04 MED ORDER — DARUNAVIR-COBICISTAT 800-150 MG PO TABS: 1.0000 | ORAL_TABLET | Freq: Every day | ORAL | 5 refills | Status: DC

## 2017-02-04 NOTE — Progress Notes (Signed)
[facility] Visit  HPI: Christine Dawson is a 52 y.o. female who presents to the [facility] pharmacy clinic for follow-up of her HIV infection.  She has not been seen since 04/09/15.  Patient Active Problem List   Diagnosis Date Noted  . Foreign body in ear 01/13/2013  . Tobacco abuse 11/10/2011  . Hypertension 11/10/2011  . Bronchitis 04/14/2011  . LGSIL of cervix of undetermined significance 01/01/2011  . Human immunodeficiency virus (HIV) disease (HCC) 09/28/2006  . SYPHILIS NOS 09/28/2006  . ALCOHOL ABUSE 09/28/2006  . HEPATITIS B, HX OF 09/28/2006      Medication List        Accurate as of 02/04/17  3:24 PM. Always use your most recent med list.          darunavir-cobicistat 800-150 MG tablet Commonly known as:  PREZCOBIX Take 1 tablet by mouth daily with breakfast.   hydrOXYzine 10 MG tablet Commonly known as:  ATARAX/VISTARIL Take 1 tablet (10 mg total) by mouth 3 (three) times daily as needed.   * metroNIDAZOLE 500 MG tablet Commonly known as:  FLAGYL TAKE 1 TABLET BY MOUTH TWICE DAILY FOR 7 DAYS   * metroNIDAZOLE 500 MG tablet Commonly known as:  FLAGYL TAKE 1 TABLET BY MOUTH TWICE DAILY FOR 7 DAYS   ODEFSEY 200-25-25 MG Tabs tablet Generic drug:  emtricitabine-rilpivir-tenofovir AF TAKE 1 TABLET BY MOUTH DAILY WITH BREAKFAST      * This list has 2 medication(s) that are the same as other medications prescribed for you. Read the directions carefully, and ask your doctor or other care provider to review them with you.          Where to Get Your Medications    You can get these medications from any pharmacy   Bring a paper prescription for each of these medications  darunavir-cobicistat 800-150 MG tablet     Allergies: No Known Allergies  Past Medical History: Past Medical History:  Diagnosis Date  . Abnormal Pap smear 08/2010  . HIV infection [facility])     Social History: Social History   Socioeconomic History  .  Marital status: Married    Spouse name: Not on file  . Number of children: Not on file  . Years of education: Not on file  . Highest education level: Not on file  Social Needs  . Financial resource strain: Not on file  . Food insecurity - worry: Not on file  . Food insecurity - inability: Not on file  . Transportation needs - medical: Not on file  . Transportation needs - non-medical: Not on file  Occupational History  . Not on file  Tobacco Use  . Smoking status: Current Every Day Smoker    Packs/day: 0.50    Types: Cigarettes  . Smokeless tobacco: Never Used  . Tobacco comment: cutting back  Substance and Sexual Activity  . Alcohol use: Yes    Alcohol/week: 12.6 oz    Types: 21 Standard drinks or equivalent per week    Comment: beer  . Drug use: No  . Sexual activity: No    Comment: refused condom  Other Topics Concern  . Not on file  Social History Narrative  . Not on file    Labs: HIV 1 RNA Quant (copies/mL)  Date Value  06/16/2016 <20 DETECTED (A)  01/24/2016 <20  04/09/2015 <20   CD4 T Cell Abs (/uL)  Date Value  06/16/2016 600  01/24/2016 520  04/09/2015 370 (  L)   Hep B S Ab (no units)  Date Value  01/13/2013 NEG   Hepatitis B Surface Ag (no units)  Date Value  06/12/2010 NEGATIVE   HCV Ab (no units)  Date Value  01/23/2011 NEGATIVE    Lipids:    Component Value Date/Time   CHOL 164 06/16/2016 1525   TRIG 208 (H) 06/16/2016 1525   HDL 66 06/16/2016 1525   CHOLHDL 2.5 06/16/2016 1525   VLDL 42 (H) 06/16/2016 1525   LDLCALC 56 06/16/2016 1525    Current HIV Regimen: Odefsey + Prezcobix  Assessment: [name] is here today to follow-up for her HIV infection.  She was last seen by Dr. Ninetta Lights on 04/09/15 and has no showed >5 times. She comes in today having been off of her medications x 1.5 months. Her ADAP expired.  She states she was taking them consistently prior to running out.  I asked what she was taking and she had a hard time picking them  out on the medication chart.  Not sure she is taking the right regimen at all. Although, when it was last checked, her viral load was undetectable back in May of 2018. 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    She states she is not sexually active and has not been in over a year.  She is not having any issues or s/sx of any sickness at this time - no fever, night sweats, sore throat, SOB, chest pain, etc. She is feeling well.  Her ADAP has expired and she did not bring in her paystubs for [place] today.  She swears she will bring them back in the morning. There is no way to get same day medication for Prezcobix, so I filled out a [employer] application for her but will still need her paystubs to submit.  Once she brings them back, I can sign her up through them for Prezcobix and get Odefsey through [place] advancing access program.  I told her she needed to be very careful to not run out of medications anymore as she already has some extensive resistance to integrase and NRTIs as it stands.  Cumulative HIV Genotype Data  RT Mutations  M184V  PI Mutations   Integrase Mutations  G140S, Q148H   Interpretation of Genotype Data per Stanford HIV Drug Resistance Database:  Nucleoside RTIs  Abacavir - low level resistance Zidovudine - susceptible Emtricitabine - high level resistance Lamivudine - high level resistance Tenofovir - susceptible   Non-Nucleoside RTIs  Doravirine - susceptible Efavirenz - susceptible Etravirine - susceptible Nevirapine - susceptible Rilpivirine - susceptible   Protease Inhibitors  Atazanavir - susceptible Darunavir - susceptible Lopinavir - susceptible   Integrase Inhibitors  Bictegravir - intermediate resistance Dolutegravir - intermediate resistance Elvitegravir - high level resistance Raltegravir - high level resistance   She understands about resistance and knows she  cannot afford to mess up her current regimen.  I will get all labs today and hopefully she will bring back her paystubs tomorrow so we can get her back on medications. I will have her come back and see me in 4-6 weeks before I schedule her with Dr. Ninetta Lights.  Plan: - HIV RNA with resistance, CD4, RPR, CMET, CBC, lipid profile - Bring back paystubs ASAP - F/u with me again 2/12 at 9am  Jerrol Helmers L. Ala Kratz, PharmD, AAHIVP, CPP Infectious Diseases Clinical Pharmacist [facility]  02/04/2017, 3:24 PM

## 2017-02-05 LAB — LIPID PANEL
CHOLESTEROL: 134 mg/dL (ref <200)
HDL: 68 mg/dL (ref >50)
LDL CHOLESTEROL (CALC): 45 mg/dL
Non-HDL Cholesterol (Calc): 66 mg/dL (calc) (ref <130)
TRIGLYCERIDES: 127 mg/dL (ref <150)
Total CHOL/HDL Ratio: 2 (calc) (ref <5.0)

## 2017-02-05 LAB — T-HELPER CELL (CD4) - (RCID CLINIC ONLY)
CD4 T CELL ABS: 160 /uL — AB (ref 400–2700)
CD4 T CELL HELPER: 14 % — AB (ref 33–55)

## 2017-02-05 LAB — CBC
HEMATOCRIT: 38.3 % (ref 35.0–45.0)
Hemoglobin: 12.7 g/dL (ref 11.7–15.5)
MCH: 29 pg (ref 27.0–33.0)
MCHC: 33.2 g/dL (ref 32.0–36.0)
MCV: 87.4 fL (ref 80.0–100.0)
MPV: 10.1 fL (ref 7.5–12.5)
PLATELETS: 177 10*3/uL (ref 140–400)
RBC: 4.38 10*6/uL (ref 3.80–5.10)
RDW: 12.8 % (ref 11.0–15.0)
WBC: 2.9 10*3/uL — AB (ref 3.8–10.8)

## 2017-02-05 LAB — COMPREHENSIVE METABOLIC PANEL
AG Ratio: 1.1 (calc) (ref 1.0–2.5)
ALT: 10 U/L (ref 6–29)
AST: 21 U/L (ref 10–35)
Albumin: 4.2 g/dL (ref 3.6–5.1)
Alkaline phosphatase (APISO): 69 U/L (ref 33–130)
BILIRUBIN TOTAL: 0.5 mg/dL (ref 0.2–1.2)
BUN: 8 mg/dL (ref 7–25)
CALCIUM: 9.5 mg/dL (ref 8.6–10.4)
CO2: 28 mmol/L (ref 20–32)
Chloride: 104 mmol/L (ref 98–110)
Creat: 0.71 mg/dL (ref 0.50–1.05)
Globulin: 3.7 g/dL (calc) (ref 1.9–3.7)
Glucose, Bld: 93 mg/dL (ref 65–99)
Potassium: 6 mmol/L — ABNORMAL HIGH (ref 3.5–5.3)
SODIUM: 137 mmol/L (ref 135–146)
TOTAL PROTEIN: 7.9 g/dL (ref 6.1–8.1)

## 2017-02-05 LAB — RPR: RPR Ser Ql: NONREACTIVE

## 2017-02-06 ENCOUNTER — Other Ambulatory Visit: Payer: Self-pay | Admitting: Pharmacist

## 2017-02-10 ENCOUNTER — Other Ambulatory Visit: Payer: Self-pay | Admitting: Pharmacist

## 2017-02-12 ENCOUNTER — Other Ambulatory Visit: Payer: Self-pay | Admitting: Pharmacist

## 2017-02-13 ENCOUNTER — Other Ambulatory Visit: Payer: Self-pay | Admitting: Pharmacist

## 2017-02-13 ENCOUNTER — Encounter: Payer: Self-pay | Admitting: Infectious Diseases

## 2017-02-16 ENCOUNTER — Other Ambulatory Visit: Payer: Self-pay | Admitting: Pharmacist

## 2017-02-16 ENCOUNTER — Telehealth: Payer: Self-pay | Admitting: Pharmacist

## 2017-02-16 DIAGNOSIS — B20 Human immunodeficiency virus [HIV] disease: Secondary | ICD-10-CM

## 2017-02-16 LAB — HIV RNA, RTPCR W/R GT (RTI, PI,INT)
HIV 1 RNA QUANT: 258000 {copies}/mL — AB
HIV-1 RNA QUANT, LOG: 5.41 {Log_copies}/mL — AB

## 2017-02-16 LAB — HIV-1 INTEGRASE GENOTYPE

## 2017-02-16 LAB — HIV-1 GENOTYPE: HIV-1 GENOTYPE: DETECTED — AB

## 2017-02-16 MED ORDER — EMTRICITAB-RILPIVIR-TENOFOV AF 200-25-25 MG PO TABS: 1.0000 | ORAL_TABLET | Freq: Every day | ORAL | 5 refills | Status: DC

## 2017-02-16 MED FILL — ODEFSEY 200-25-25 MG TABS: 200-25-25 | 30 days supply | Qty: 30 | Fill #0

## 2017-02-16 NOTE — Telephone Encounter (Signed)
I was able to get Goldy Prezcobix through [employer] and [place]defsey through [employer].  She has enough until her HMAP is approved.

## 2017-02-16 NOTE — Telephone Encounter (Signed)
Thanks [employer]

## 2017-03-04 ENCOUNTER — Other Ambulatory Visit: Payer: Self-pay | Admitting: Infectious Diseases

## 2017-03-04 DIAGNOSIS — B20 Human immunodeficiency virus [HIV] disease: Secondary | ICD-10-CM

## 2017-03-16 MED FILL — ODEFSEY 200-25-25 MG TABS: 200-25-25 | 30 days supply | Qty: 30 | Fill #1

## 2017-03-17 ENCOUNTER — Ambulatory Visit: Payer: Self-pay

## 2017-03-19 ENCOUNTER — Telehealth: Payer: Self-pay | Admitting: Pharmacist

## 2017-03-19 NOTE — Telephone Encounter (Signed)
Christine Dawson missed an appointment with us earlier this week. Called her to reschedule but had to leave a voicemail. Will try again later.

## 2017-03-27 ENCOUNTER — Telehealth: Payer: Self-pay | Admitting: Pharmacist

## 2017-03-27 NOTE — Telephone Encounter (Signed)
Tried to call Suprina again to get her to come in but no answer again - left VM.

## 2017-03-31 ENCOUNTER — Other Ambulatory Visit: Payer: Self-pay | Admitting: Infectious Diseases

## 2017-03-31 DIAGNOSIS — B20 Human immunodeficiency virus [HIV] disease: Secondary | ICD-10-CM

## 2017-04-07 ENCOUNTER — Ambulatory Visit: Payer: Self-pay

## 2017-04-08 ENCOUNTER — Telehealth: Payer: Self-pay | Admitting: Pharmacist

## 2017-04-08 ENCOUNTER — Other Ambulatory Visit: Payer: Self-pay | Admitting: Pharmacist

## 2017-04-08 DIAGNOSIS — B20 Human immunodeficiency virus [HIV] disease: Secondary | ICD-10-CM

## 2017-04-08 MED ORDER — DARUNAVIR-COBICISTAT 800-150 MG PO TABS: 1.0000 | ORAL_TABLET | Freq: Every day | ORAL | 1 refills | Status: DC

## 2017-04-08 MED ORDER — EMTRICITAB-RILPIVIR-TENOFOV AF 200-25-25 MG PO TABS: 1.0000 | ORAL_TABLET | Freq: Every day | ORAL | 1 refills | Status: DC

## 2017-04-08 NOTE — Telephone Encounter (Signed)
Christine Dawson missed her appointment with us again yesterday. Called and left her a VM. Not sure she is taking her medications.

## 2017-04-09 MED FILL — ODEFSEY 200-25-25 MG TABS: 200-25-25 | 30 days supply | Qty: 30 | Fill #2

## 2017-04-28 ENCOUNTER — Other Ambulatory Visit: Payer: Self-pay | Admitting: Infectious Diseases

## 2017-04-28 DIAGNOSIS — B20 Human immunodeficiency virus [HIV] disease: Secondary | ICD-10-CM

## 2017-05-04 ENCOUNTER — Other Ambulatory Visit: Payer: Self-pay | Admitting: Infectious Diseases

## 2017-05-04 DIAGNOSIS — B20 Human immunodeficiency virus [HIV] disease: Secondary | ICD-10-CM

## 2017-05-08 MED FILL — ODEFSEY 200-25-25 MG TABS: 200-25-25 | 30 days supply | Qty: 30 | Fill #3

## 2017-05-28 ENCOUNTER — Other Ambulatory Visit: Payer: Self-pay | Admitting: Pharmacist

## 2017-07-09 ENCOUNTER — Telehealth: Payer: Self-pay | Admitting: *Deleted

## 2017-07-09 NOTE — Telephone Encounter (Signed)
Referral received during [facility] Dr. Moshe CiproHatcher's order is for me to begin engagement attempts with the patient and offer assistance as needed. Focus should be on addressing the patient's barrier to care and medication adherence.  Contacted Ms. Aurther Lofterry today and after introducing myself Ms Aurther Lofterry stated she will be sure to give me a call back after she leaves work in 30 minutes. Waiting on return call at this time. I would like to be sure Ms. Aurther Lofterry has access to her meds and transportation to and from appointments

## 2017-10-29 ENCOUNTER — Telehealth: Payer: Self-pay

## 2017-10-29 NOTE — Telephone Encounter (Signed)
Patient is on the overdue pap list. Attempted to call patient to schedule an appointment to see Rexene Alberts, NP to have pap smear done if she has not had one done at another office. Left a voicemail asking patient to call back when available. Lorenso Courier, [place]

## 2017-10-30 NOTE — Telephone Encounter (Signed)
Robert Petersen called back, informed her she was over due for her Pap smear.  She states she has not had one done at another office.  Scheduled her PAP 11/20/2017 at 10:15 with Rexene Alberts NP. Angeline Slim RN

## 2017-11-20 ENCOUNTER — Ambulatory Visit: Payer: Self-pay | Admitting: Infectious Diseases

## 2018-10-21 ENCOUNTER — Encounter: Payer: Self-pay | Admitting: Infectious Diseases

## 2018-10-28 ENCOUNTER — Ambulatory Visit: Payer: Self-pay

## 2018-10-28 ENCOUNTER — Encounter: Payer: Self-pay | Admitting: Infectious Diseases

## 2018-10-28 ENCOUNTER — Ambulatory Visit (INDEPENDENT_AMBULATORY_CARE_PROVIDER_SITE_OTHER): Payer: Self-pay | Admitting: Infectious Diseases

## 2018-10-28 ENCOUNTER — Ambulatory Visit (INDEPENDENT_AMBULATORY_CARE_PROVIDER_SITE_OTHER): Payer: Self-pay | Admitting: Pharmacist

## 2018-10-28 ENCOUNTER — Other Ambulatory Visit: Payer: Self-pay

## 2018-10-28 VITALS — BP 147/92 | HR 92 | Temp 98.4°F

## 2018-10-28 DIAGNOSIS — B20 Human immunodeficiency virus [HIV] disease: Secondary | ICD-10-CM

## 2018-10-28 DIAGNOSIS — F4321 Adjustment disorder with depressed mood: Secondary | ICD-10-CM

## 2018-10-28 DIAGNOSIS — Z720 Tobacco use: Secondary | ICD-10-CM

## 2018-10-28 DIAGNOSIS — Z113 Encounter for screening for infections with a predominantly sexual mode of transmission: Secondary | ICD-10-CM

## 2018-10-28 DIAGNOSIS — Z8619 Personal history of other infectious and parasitic diseases: Secondary | ICD-10-CM

## 2018-10-28 DIAGNOSIS — R87612 Low grade squamous intraepithelial lesion on cytologic smear of cervix (LGSIL): Secondary | ICD-10-CM

## 2018-10-28 DIAGNOSIS — Z23 Encounter for immunization: Secondary | ICD-10-CM

## 2018-10-28 DIAGNOSIS — F1010 Alcohol abuse, uncomplicated: Secondary | ICD-10-CM

## 2018-10-28 MED ORDER — PREZCOBIX 800-150 MG PO TABS: 1.0000 | ORAL_TABLET | Freq: Every day | ORAL | 1 refills | Status: DC

## 2018-10-28 MED ORDER — ODEFSEY 200-25-25 MG PO TABS: 1.0000 | ORAL_TABLET | Freq: Every day | ORAL | 1 refills | Status: DC

## 2018-10-28 MED ORDER — SULFAMETHOXAZOLE-TRIMETHOPRIM 800-160 MG PO TABS: 1.0000 | ORAL_TABLET | ORAL | 5 refills | Status: DC

## 2018-10-28 NOTE — Patient Instructions (Addendum)
It was wonderful to meet you today! We are looking forward to getting you back on your medications and healthy again.   Medications to start back: 1. Odefsey, one pill once a day with food (full chewable meal) 2. Prezcobix, one pill once a day with food (full chewable meal) 3. Bactrim, one pill once a day   Would like to see you back in 6 weeks so we can repeat your labs and do your pap smear.   If you find that you would like some additional support we have a wonderful counselor here you can do some talk therapy with.

## 2018-10-28 NOTE — Progress Notes (Signed)
HPI: Christine Dawson is a 53 y.o. female who presents to the [facility] pharmacy clinic for HIV follow-up.  Patient Active Problem List   Diagnosis Date Noted  . Foreign body in ear 01/13/2013  . Tobacco abuse 11/10/2011  . Hypertension 11/10/2011  . Bronchitis 04/14/2011  . LGSIL of cervix of undetermined significance 01/01/2011  . Human immunodeficiency virus (HIV) disease (HCC) 09/28/2006  . SYPHILIS NOS 09/28/2006  . ALCOHOL ABUSE 09/28/2006  . HEPATITIS B, HX OF 09/28/2006    Patient's Medications  New Prescriptions   No medications on file  Previous Medications   DARUNAVIR-COBICISTAT (PREZCOBIX) 800-150 MG TABLET    Take 1 tablet by mouth daily with breakfast. Swallow whole. Do NOT crush, break or chew tablets. Take with food.   EMTRICITABINE-RILPIVIR-TENOFOVIR AF (ODEFSEY) 200-25-25 MG TABS TABLET    Take 1 tablet by mouth daily with breakfast.   HYDROXYZINE (ATARAX/VISTARIL) 10 MG TABLET    Take 1 tablet (10 mg total) by mouth 3 (three) times daily as needed.   SULFAMETHOXAZOLE-TRIMETHOPRIM (BACTRIM DS) 800-160 MG TABLET    Take 1 tablet by mouth 3 (three) times a week.  Modified Medications   No medications on file  Discontinued Medications   No medications on file    Allergies: No Known Allergies  Past Medical History: Past Medical History:  Diagnosis Date  . Abnormal Pap smear 08/2010  . HIV infection [facility])     Social History: Social History   Socioeconomic History  . Marital status: Married    Spouse name: Not on file  . Number of children: Not on file  . Years of education: Not on file  . Highest education level: Not on file  Occupational History  . Not on file  Social Needs  . Financial resource strain: Not on file  . Food insecurity    Worry: Not on file    Inability: Not on file  . Transportation needs    Medical: Not on file    Non-medical: Not on file  Tobacco Use  . Smoking status: Current Every Day Smoker    Packs/day: 0.50    Types:  Cigarettes  . Smokeless tobacco: Never Used  . Tobacco comment: cutting back  Substance and Sexual Activity  . Alcohol use: Yes    Alcohol/week: 21.0 standard drinks    Types: 21 Standard drinks or equivalent per week    Comment: beer  . Drug use: No  . Sexual activity: Never    Comment: refused condom  Lifestyle  . Physical activity    Days per week: Not on file    Minutes per session: Not on file  . Stress: Not on file  Relationships  . Social Musician on phone: Not on file    Gets together: Not on file    Attends religious service: Not on file    Active member of club or organization: Not on file    Attends meetings of clubs or organizations: Not on file    Relationship status: Not on file  Other Topics Concern  . Not on file  Social History Narrative  . Not on file    Labs: Lab Results  Component Value Date   HIV1RNAQUANT 258,000 (H) 02/04/2017   HIV1RNAQUANT <20 DETECTED (A) 06/16/2016   HIV1RNAQUANT <20 01/24/2016   CD4TABS 160 (L) 02/04/2017   CD4TABS 600 06/16/2016   CD4TABS 520 01/24/2016    RPR and STI Lab Results  Component Value Date  LABRPR NON-REACTIVE 02/04/2017   LABRPR NON REAC 06/16/2016   LABRPR NON REAC 04/09/2015   LABRPR NON REAC 11/21/2013   LABRPR NON REAC 09/20/2012    STI Results GC CT  06/16/2016 Negative Negative    Hepatitis B Lab Results  Component Value Date   HEPBSAB NEG 01/13/2013   HEPBSAG NEGATIVE 06/12/2010   Hepatitis C No results found for: HEPCAB, HCVRNAPCRQN Hepatitis A Lab Results  Component Value Date   HAV POS (A) 01/23/2011   Lipids: Lab Results  Component Value Date   CHOL 134 02/04/2017   TRIG 127 02/04/2017   HDL 68 02/04/2017   CHOLHDL 2.0 02/04/2017   VLDL 42 (H) 06/16/2016   LDLCALC 45 02/04/2017    Current HIV Regimen: Odefsey + Prezcobix - currently off x 1 year  Assessment: [name] is here today to follow up for her HIV infection. She was last seen by me in January of 2019.  Before that, she was seen by Dr. Johnnye Sima in March of 2017.  She was previously doing well on medication but had a lapse last year.  She is grieving the passing of her mother last year whom she took care of.  She does have a good support system at home with her younger sister and younger brother and 10 grandchildren.  She is ready to get back on medication.  She has som extensive resistance (see chart below) already so we will continue her on her previous regimen of Odefsey + Prezcobix.  She states that she tolerates these well with no side effects.  She does not have any issues swallowing the pills or taking them with food.  Colletta Maryland also saw her today since it had been over 3 years since she has had a provider appointment.  She will follow up in 4-6 weeks with either myself or Stephanie. She will call if she has any issues getting her medications.  Her HMAP should be approved.  Plan: - Restart Odefsey + Prezcobix PO once daily with food - F/u with Colletta Maryland in 6 weeks  Sandford Diop L. Vernona Peake, PharmD, BCIDP, AAHIVP, [place] [facility] 10/28/2018, 3:21 PM

## 2018-10-28 NOTE — Progress Notes (Signed)
Name: Christine Dawson  DOB: 05/31/1965 MRN: [id number] PCP: Patient, No Pcp Per    Patient Active Problem List   Diagnosis Date Noted  . AIDS (acquired immune deficiency syndrome) ([place]) 10/30/2018  . Grief reaction 10/30/2018  . Tobacco abuse 11/10/2011  . Hypertension 11/10/2011  . LGSIL of cervix of undetermined significance 01/01/2011  . Human immunodeficiency virus (HIV) disease ([place]) 09/28/2006  . SYPHILIS NOS 09/28/2006  . Alcohol abuse 09/28/2006  . History of hepatitis B 09/28/2006     Brief Narrative:  Christine Dawson is a 53 y.o. female with HIV, +AIDS, Dx 09-2006. CD4 nadir 58 (10-2018) VL 258,000 Jan 2019   HIV Risk: heterosexual  History of OIs: unknown Intake Labs 2014 Hep B sAg (- 2012), sAb (-), cAb (); Hep A (+2012), Hep C (- 2012) Quantiferon () HLA B*5701 (- 2013) G6PD: ()   Previous Regimens: . Odefsey + Prezcobix   Genotypes: . Cumulative Genotype 02-2017 Nucleoside RTIs  Abacavir - low level resistance Zidovudine - susceptible Emtricitabine - high level resistance Lamivudine - high level resistance Tenofovir - susceptible   Non-Nucleoside RTIs  Doravirine - susceptible Efavirenz - susceptible Etravirine - susceptible Nevirapine - susceptible Rilpivirine - susceptible   Protease Inhibitors  Atazanavir - susceptible Darunavir - susceptible Lopinavir - susceptible   Integrase Inhibitors  Bictegravir - intermediate resistance Dolutegravir - intermediate resistance Elvitegravir - high level resistance Raltegravir - high level resistance    Subjective:  CC: Returning to care for HIV. + AIDS. No concerns today aside from general process of getting back on medications.    HPI: [name] is here seeing a provider in our clinic for the first time since 2017 when she previously followed with Dr. Johnnye Sima. She feels she is ready to get back on her medications and take care of her health. She estimates that the last time she took any HIV  medications was Sept 2019, and really was not taking them regularly since 2018. The reason she stopped was that she was overwhelmed with taking care of her mother, who recently passed away. Christine Dawson now lives with her brother and has many grandchildren around her for support.   She has a history of multidrug resistance with genotype detailed above. She understands what this means in general as we discussed. She does not know what her last counts were and uncertain what the difference is between HIV and AIDS.   She has no concerns today suggestive of associated opportunistic infection or advancing HIV disease such as fevers, night sweats, weight loss, anorexia, cough, SOB, nausea, vomiting, diarrhea, headache, sensory changes, lymphadenopathy or oral thrush. She is eating well and sleeping well. Working full time at [employer] at [place] currently. Not sexually active currently but has had a small amount of female partners since last office visit.    Depression screen PHQ 2/9 10/28/2018  Decreased Interest 0  Down, Depressed, Hopeless 0  PHQ - 2 Score 0   She does not have any additions/changes to her medical history. Has not had any significant illnesses or ER visits/Hospitalizations. She does not have a primary care provider. Her last pap smear was in 2014. Her last mammogram was in 2010.   Review of Systems  Constitutional: Negative for chills, fever, malaise/fatigue and weight loss.  HENT: Negative for sore throat.   Respiratory: Negative for cough, sputum production and shortness of breath.   Cardiovascular: Negative for chest pain and leg swelling.  Gastrointestinal: Negative for abdominal pain, diarrhea and vomiting.  Musculoskeletal: Negative for joint pain, myalgias and neck pain.  Skin: Negative for rash.  Neurological: Negative for weakness and headaches.  Psychiatric/Behavioral: Negative for depression and substance abuse. The patient is not nervous/anxious.     Past Medical History:   Diagnosis Date  . Abnormal Pap smear 08/2010  . HIV infection ([place])     Outpatient Medications Prior to Visit  Medication Sig Dispense Refill  . hydrOXYzine (ATARAX/VISTARIL) 10 MG tablet Take 1 tablet (10 mg total) by mouth 3 (three) times daily as needed. (Patient not taking: Reported on 06/27/2015) 30 tablet 0  . darunavir-cobicistat (PREZCOBIX) 800-150 MG tablet Take 1 tablet by mouth daily with breakfast. Swallow whole. Do NOT crush, break or chew tablets. Take with food. (Patient not taking: Reported on 10/28/2018) 30 tablet 1  . emtricitabine-rilpivir-tenofovir AF (ODEFSEY) 200-25-25 MG TABS tablet Take 1 tablet by mouth daily with breakfast. (Patient not taking: Reported on 10/28/2018) 30 tablet 1  . metroNIDAZOLE (FLAGYL) 500 MG tablet TAKE 1 TABLET BY MOUTH TWICE DAILY FOR 7 DAYS (Patient not taking: Reported on 10/28/2018) 14 tablet 0  . metroNIDAZOLE (FLAGYL) 500 MG tablet TAKE 1 TABLET BY MOUTH TWICE DAILY FOR 7 DAYS (Patient not taking: Reported on 10/28/2018) 14 tablet 0  . ODEFSEY 200-25-25 MG TABS tablet TAKE 1 TABLET BY MOUTH DAILY WITH BREAKFAST (Patient not taking: Reported on 10/28/2018) 30 tablet 0  . PREZCOBIX 800-150 MG tablet TAKE 1 TABLET BY MOUTH DAILY WITH FOOD( SWALLOW WHOLE, DO NOT CRUSH, BREAK OR CHEW TABLETS) (Patient not taking: Reported on 10/28/2018) 30 tablet 0   No facility-administered medications prior to visit.      No Known Allergies  Social History   Tobacco Use  . Smoking status: Current Every Day Smoker    Packs/day: 0.50    Types: Cigarettes  . Smokeless tobacco: Never Used  . Tobacco comment: cutting back  Substance Use Topics  . Alcohol use: Yes    Alcohol/week: 21.0 standard drinks    Types: 21 Standard drinks or equivalent per week    Comment: beer  . Drug use: No    Family History  Problem Relation Age of Onset  . Cancer Mother 77       breast  . Hypertension Mother   . Cancer Other   . Hypertension Sister     Social History    Substance and Sexual Activity  Sexual Activity Never   Comment: refused condom     Objective:   Vitals:   10/28/18 1434  BP: (!) 147/92  Pulse: 92  Temp: 98.4 F (36.9 C)   There is no height or weight on file to calculate BMI.  Physical Exam Constitutional:      Comments: Thin but well-appearing  HENT:     Mouth/Throat:     Mouth: No oral lesions.     Dentition: No dental abscesses.  Cardiovascular:     Rate and Rhythm: Normal rate and regular rhythm.     Heart sounds: Normal heart sounds.  Pulmonary:     Effort: Pulmonary effort is normal.     Breath sounds: Normal breath sounds.  Abdominal:     General: There is no distension.     Palpations: Abdomen is soft.     Tenderness: There is no abdominal tenderness.  Musculoskeletal: Normal range of motion.        General: No tenderness.  Lymphadenopathy:     Cervical: No cervical adenopathy.  Skin:    General: Skin is warm  and dry.     Findings: No rash.  Neurological:     Mental Status: She is alert and oriented to person, place, and time.  Psychiatric:        Judgment: Judgment normal.     Lab Results Lab Results  Component Value Date   WBC 3.6 (L) 10/28/2018   HGB 11.9 10/28/2018   HCT 36.5 10/28/2018   MCV 90.1 10/28/2018   PLT 199 10/28/2018    Lab Results  Component Value Date   CREATININE 0.70 10/28/2018   BUN 8 10/28/2018   NA 140 10/28/2018   K 4.7 10/28/2018   CL 106 10/28/2018   CO2 28 10/28/2018    Lab Results  Component Value Date   ALT 9 10/28/2018   AST 22 10/28/2018   ALKPHOS 58 06/16/2016   BILITOT 0.5 10/28/2018    Lab Results  Component Value Date   CHOL 134 02/04/2017   HDL 68 02/04/2017   LDLCALC 45 02/04/2017   TRIG 127 02/04/2017   CHOLHDL 2.0 02/04/2017   HIV 1 RNA Quant (copies/mL)  Date Value  02/04/2017 258,000 (H)  06/16/2016 <20 DETECTED (A)  01/24/2016 <20   CD4 T Cell Abs (/uL)  Date Value  10/28/2018 54 (L)  02/04/2017 160 (L)  06/16/2016 600      Assessment & Plan:   Problem List Items Addressed This Visit      Unprioritized   Human immunodeficiency virus (HIV) disease ([place]) (Chronic)    She has a 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 Based on her last cumulative genotype will have her continue the [facility] and Prezcobix once a day with a full chewable meal. Counseled on proper use. She spent time discussing with our pharmacist, [name]. Counseled on importance of strict daily adherence to ensure we can treat her in the future.  Will update labs today including RPR screen.  Flu vaccine administered today.  She will follow up in 4-6 weeks to repeat her labs and offer pap smear/coordinate mammogram.       Relevant Medications   darunavir-cobicistat (PREZCOBIX) 800-150 MG tablet   emtricitabine-rilpivir-tenofovir AF (ODEFSEY) 200-25-25 MG TABS tablet   sulfamethoxazole-trimethoprim (BACTRIM DS) 800-160 MG tablet   Alcohol abuse    She has a history of long-standing heavy alcohol consumption. She drinks < 4 drinks a day now and never binges anymore. She is interested in reducing further and feels her family (grandchildren especially) are helping her slowly achieve this. I offered support with our team. If her LFTs are not significantly elevated could offer her PO Naltrexone in the future to reduce urges if she is interested.       History of hepatitis B    Last surface antigen negative in 2014 with negative DNA.  Will repeat panel at next lab draw including core Ab to determine if this is correct/cleared. She is on tenofovir regimen until we get more information.       LGSIL of cervix of undetermined significance    Will have her update her pap smear at next office visit and continue with annual screenings for now.      Tobacco abuse    Ongoing - will counsel and discuss in the future at a more appropriate time for her. She is not ready to consider quitting yet.       AIDS (acquired immune deficiency  syndrome) ([place])    Unfortunately in a short timeframe her immune function has drastically reduced; 2018 her  CD4 was 600 and 18 months later down to 160. I do not see any findings c/w OI today on exam. I explained to her that I would presume her CD4 count to be even lower - will go ahead and initiate bactrim prophylaxis 1 DS daily. Will hold off on azithromycin to see what count is.  Intentional time spent discussing the difference between HIV and AIDS; offered that she likely can rebuild her immune system again if she resumes perfect adherence with her medical regimen and stays on it long term. Should she interrupt therapy as she has in the pass can cause emergence of even more resistance which could result in inability to treat her completely.       Relevant Medications   darunavir-cobicistat (PREZCOBIX) 800-150 MG tablet   emtricitabine-rilpivir-tenofovir AF (ODEFSEY) 200-25-25 MG TABS tablet   sulfamethoxazole-trimethoprim (BACTRIM DS) 800-160 MG tablet   Grief reaction    She was tearful today in discussion about her mother's passing and clearly still grieving. We discussed opportunity to engage with our counselor, Marcie Bal today. She will consider if she needs additional support - she is religious and has her sister's support which she feels like is adequate.        Other Visit Diagnoses    Human immunodeficiency virus (HIV) disease ([place])       Relevant Medications   darunavir-cobicistat (PREZCOBIX) 800-150 MG tablet   emtricitabine-rilpivir-tenofovir AF (ODEFSEY) 200-25-25 MG TABS tablet   sulfamethoxazole-trimethoprim (BACTRIM DS) 800-160 MG tablet   Other Relevant Orders   T-helper cell (CD4)- ([facility] clinic only) (Completed)   HIV-1 RNA ultraquant reflex to gentyp+   RPR (Completed)   COMPLETE METABOLIC PANEL WITH GFR (Completed)   CBC with Differential/Platelet (Completed)   Need for immunization against influenza       Relevant Orders   Flu Vaccine QUAD 36+ mos IM (Completed)       Janene Madeira, MSN, NP-C [facility] for Infectious [place] Pager: [phone] Office: [phone]  10/30/18  11:45 AM

## 2018-10-29 LAB — T-HELPER CELL (CD4) - (RCID CLINIC ONLY)
CD4 % Helper T Cell: 9 % — ABNORMAL LOW (ref 33–65)
CD4 T Cell Abs: 54 /uL — ABNORMAL LOW (ref 400–1790)

## 2018-10-29 NOTE — Progress Notes (Signed)
Syphilis test + with confirmatory positive.   Please call the patient to schedule 3 weekly shots of 2.4 million units bicillin IM.  She had a negative test in January 2019 so I suspect she acquired this infection at some point during that time frame from a sexual partner.

## 2018-10-30 DIAGNOSIS — F4321 Adjustment disorder with depressed mood: Secondary | ICD-10-CM | POA: Insufficient documentation

## 2018-10-30 DIAGNOSIS — F4320 Adjustment disorder, unspecified: Secondary | ICD-10-CM | POA: Insufficient documentation

## 2018-10-30 DIAGNOSIS — B20 Human immunodeficiency virus [HIV] disease: Secondary | ICD-10-CM | POA: Insufficient documentation

## 2018-10-30 NOTE — Assessment & Plan Note (Signed)
Unfortunately in a short timeframe her immune function has drastically reduced; 2018 her CD4 was 600 and 18 months later down to 160. I do not see any findings c/w OI today on exam. I explained to her that I would presume her CD4 count to be even lower - will go ahead and initiate bactrim prophylaxis 1 DS daily. Will hold off on azithromycin to see what count is.  Intentional time spent discussing the difference between HIV and AIDS; offered that she likely can rebuild her immune system again if she resumes perfect adherence with her medical regimen and stays on it long term. Should she interrupt therapy as she has in the pass can cause emergence of even more resistance which could result in inability to treat her completely.

## 2018-10-30 NOTE — Assessment & Plan Note (Signed)
Last surface antigen negative in 2014 with negative DNA.  Will repeat panel at next lab draw including core Ab to determine if this is correct/cleared. She is on tenofovir regimen until we get more information.

## 2018-10-30 NOTE — Assessment & Plan Note (Signed)
She has a history of long-standing heavy alcohol consumption. She drinks < 4 drinks a day now and never binges anymore. She is interested in reducing further and feels her family (grandchildren especially) are helping her slowly achieve this. I offered support with our team. If her LFTs are not significantly elevated could offer her PO Naltrexone in the future to reduce urges if she is interested.

## 2018-10-30 NOTE — Assessment & Plan Note (Signed)
She was tearful today in discussion about her mother's passing and clearly still grieving. We discussed opportunity to engage with our counselor, Marcie Bal today. She will consider if she needs additional support - she is religious and has her sister's support which she feels like is adequate.

## 2018-10-30 NOTE — Assessment & Plan Note (Addendum)
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 Based on her last cumulative genotype will have her continue the [facility] and Prezcobix once a day with a full chewable meal. Counseled on proper use. She spent time discussing with our pharmacist, [name]. Counseled on importance of strict daily adherence to ensure we can treat her in the future.  Will update labs today including RPR screen.  Flu vaccine administered today.  She will follow up in 4-6 weeks to repeat her labs and offer pap smear/coordinate mammogram.

## 2018-10-30 NOTE — Assessment & Plan Note (Signed)
Ongoing - will counsel and discuss in the future at a more appropriate time for her. She is not ready to consider quitting yet.

## 2018-10-30 NOTE — Assessment & Plan Note (Signed)
Will have her update her pap smear at next office visit and continue with annual screenings for now.

## 2018-11-01 ENCOUNTER — Telehealth: Payer: Self-pay | Admitting: Pharmacy Technician

## 2018-11-01 ENCOUNTER — Telehealth: Payer: Self-pay

## 2018-11-01 NOTE — Telephone Encounter (Signed)
Called patient to relay Janene Madeira, Np message regarding positive lab for syphillis. Per [place] patient will need to come into clinic once a week x3 weeks for bicillin 2.4 million units.  Relayed message to patient who is was able to agree to an appointment. Understands she must inform recent partners to get tested/treated at health department. Patient understands the importance of getting treatment and does not have questions at this time. "Please call the patient to schedule 3 weekly shots of 2.4 million units bicillin IM.  She had a negative test in January 2019 so I suspect she acquired this infection at some point during that time frame from a sexual partner." -Janene Madeira, [place], [place]

## 2018-11-01 NOTE — Telephone Encounter (Signed)
[facility] Patient Advocate Encounter  Completed and sent [employer] Advancing Access application for [facility] for this patient who is uninsured.     BIN: [id number] PCN: [id number] GRP: [id number] VA:[id number]  She has since been approved for HMAP.  This award can be saved if needed in the future to bridge.  Venida Jarvis. Nadara Mustard [place] Patient [facility] for Infectious Disease Phone: [phone] Fax:  [phone]

## 2018-11-03 ENCOUNTER — Other Ambulatory Visit: Payer: Self-pay

## 2018-11-03 ENCOUNTER — Ambulatory Visit (INDEPENDENT_AMBULATORY_CARE_PROVIDER_SITE_OTHER): Payer: Self-pay

## 2018-11-03 DIAGNOSIS — A539 Syphilis, unspecified: Secondary | ICD-10-CM

## 2018-11-03 DIAGNOSIS — A64 Unspecified sexually transmitted disease: Secondary | ICD-10-CM

## 2018-11-03 MED ORDER — PENICILLIN G BENZATHINE 1200000 UNIT/2ML IM SUSP
1.2000 10*6.[IU] | Freq: Once | INTRAMUSCULAR | Status: AC
  Administered 2018-11-03: 1.2 10*6.[IU] via INTRAMUSCULAR

## 2018-11-03 NOTE — Progress Notes (Signed)
Patient in office today for 1 of 3 weekly bicillin injections. Verified orders.  She had a negative test in January 2019 so I suspect she acquired this infection at some point during that time frame from a sexual partner." -Janene Madeira, Np   Patient offered/accepted condoms. Understands she must inform recent partners to get tested/treated at health department.  Injections administered and patient tolerated well. Patient advise to preform deep squats to work out her muscle, alternate between tylenol/ibpurfen as needed as well as use warm compress as needed. Patient verbalized understanding.  Patient reminded of next upcoming weekly appointments. Patient remained in office to observe of adverse reaction/no reactions noted.  Chaperone present during IM administration: [place].

## 2018-11-05 NOTE — Telephone Encounter (Signed)
[facility] Patient Advocate Encounter   Patient has been approved for [employer] Advancing Access Patient Assistance Program for Odefsey from 10/29/2018 to 10/29/2019. This assistance will make the patient's copay $0.  The patient has submitted an application for RW/UMAP on 10/21/2018. I shared this information with [employer] when they called. The application sent in had a different SSN than the previous application. Documentation from the state and Philis [place]endt has her SSN as **[id number] and Epic has **[phone]. I provided them the number from the state documentation and they approved her for the year. This can be utilized if she runs into problems during re certification time period.    The billing information is the same as below.  Patient knows to call the office with questions or concerns.  Bartholomew Crews, CPhT [facility] Patient [facility] for Infectious Disease Phone: [phone] Fax: [phone] 11/05/2018 11:25 AM

## 2018-11-10 ENCOUNTER — Other Ambulatory Visit: Payer: Self-pay

## 2018-11-10 ENCOUNTER — Ambulatory Visit (INDEPENDENT_AMBULATORY_CARE_PROVIDER_SITE_OTHER): Payer: Self-pay

## 2018-11-10 DIAGNOSIS — A539 Syphilis, unspecified: Secondary | ICD-10-CM

## 2018-11-10 LAB — COMPLETE METABOLIC PANEL WITH GFR
AG Ratio: 1.1 (calc) (ref 1.0–2.5)
ALT: 9 U/L (ref 6–29)
AST: 22 U/L (ref 10–35)
Albumin: 3.7 g/dL (ref 3.6–5.1)
Alkaline phosphatase (APISO): 61 U/L (ref 37–153)
BUN: 8 mg/dL (ref 7–25)
CO2: 28 mmol/L (ref 20–32)
Calcium: 9.1 mg/dL (ref 8.6–10.4)
Chloride: 106 mmol/L (ref 98–110)
Creat: 0.7 mg/dL (ref 0.50–1.05)
GFR, Est African American: 114 mL/min/{1.73_m2} (ref >=60)
GFR, Est Non African American: 98 mL/min/{1.73_m2} (ref >=60)
Globulin: 3.5 g/dL (calc) (ref 1.9–3.7)
Glucose, Bld: 78 mg/dL (ref 65–99)
Potassium: 4.7 mmol/L (ref 3.5–5.3)
Sodium: 140 mmol/L (ref 135–146)
Total Bilirubin: 0.5 mg/dL (ref 0.2–1.2)
Total Protein: 7.2 g/dL (ref 6.1–8.1)

## 2018-11-10 LAB — CBC WITH DIFFERENTIAL/PLATELET
Absolute Monocytes: 371 cells/uL (ref 200–950)
Basophils Absolute: 29 cells/uL (ref 0–200)
Basophils Relative: 0.8 %
Eosinophils Absolute: 270 cells/uL (ref 15–500)
Eosinophils Relative: 7.5 %
HCT: 36.5 % (ref 35.0–45.0)
Hemoglobin: 11.9 g/dL (ref 11.7–15.5)
Lymphs Abs: 662 cells/uL — ABNORMAL LOW (ref 850–3900)
MCH: 29.4 pg (ref 27.0–33.0)
MCHC: 32.6 g/dL (ref 32.0–36.0)
MCV: 90.1 fL (ref 80.0–100.0)
MPV: 11 fL (ref 7.5–12.5)
Monocytes Relative: 10.3 %
Neutro Abs: 2268 cells/uL (ref 1500–7800)
Neutrophils Relative %: 63 %
Platelets: 199 10*3/uL (ref 140–400)
RBC: 4.05 10*6/uL (ref 3.80–5.10)
RDW: 12.3 % (ref 11.0–15.0)
Total Lymphocyte: 18.4 %
WBC: 3.6 10*3/uL — ABNORMAL LOW (ref 3.8–10.8)

## 2018-11-10 LAB — FLUORESCENT TREPONEMAL AB(FTA)-IGG-BLD: Fluorescent Treponemal ABS: REACTIVE — AB

## 2018-11-10 LAB — HIV-1 RNA ULTRAQUANT REFLEX TO GENTYP+
HIV 1 RNA Quant: 465000 copies/mL — ABNORMAL HIGH
HIV-1 RNA Quant, Log: 5.67 Log copies/mL — ABNORMAL HIGH

## 2018-11-10 LAB — HIV-1 GENOTYPE: HIV-1 Genotype: DETECTED — AB

## 2018-11-10 LAB — RPR: RPR Ser Ql: REACTIVE — AB

## 2018-11-10 LAB — RPR TITER: RPR Titer: 1:1 {titer} — ABNORMAL HIGH

## 2018-11-10 MED ORDER — PENICILLIN G BENZATHINE 1200000 UNIT/2ML IM SUSP
1.2000 10*6.[IU] | Freq: Once | INTRAMUSCULAR | Status: AC
  Administered 2018-11-10: 16:00:00 1.2 10*6.[IU] via INTRAMUSCULAR

## 2018-11-10 NOTE — Progress Notes (Addendum)
Christine Dawson recieved 2/3 dose of Bicillin. Patient tolerated well, no questions or concerns. Christine Dawson is due for next dose in 1 week on 11/17/2018 at 3:30 p.m.    Chaperoned by Arnetha Courser, [place].    Lenore Cordia, [place]

## 2018-11-17 ENCOUNTER — Ambulatory Visit: Payer: Self-pay

## 2018-11-18 ENCOUNTER — Telehealth: Payer: Self-pay | Admitting: *Deleted

## 2018-11-18 ENCOUNTER — Encounter (HOSPITAL_COMMUNITY): Payer: Self-pay | Admitting: Emergency Medicine

## 2018-11-18 ENCOUNTER — Ambulatory Visit (INDEPENDENT_AMBULATORY_CARE_PROVIDER_SITE_OTHER): Payer: Self-pay | Admitting: Infectious Diseases

## 2018-11-18 ENCOUNTER — Ambulatory Visit: Payer: Self-pay

## 2018-11-18 ENCOUNTER — Other Ambulatory Visit: Payer: Self-pay

## 2018-11-18 ENCOUNTER — Emergency Department (HOSPITAL_COMMUNITY)
Admission: EM | Admit: 2018-11-18 | Discharge: 2018-11-18 | Disposition: A | Discharge status: Home / Self Care | Payer: Self-pay | Attending: Emergency Medicine | Admitting: Emergency Medicine

## 2018-11-18 ENCOUNTER — Emergency Department (HOSPITAL_COMMUNITY): Payer: Self-pay

## 2018-11-18 VITALS — BP 178/85 | HR 104

## 2018-11-18 DIAGNOSIS — B20 Human immunodeficiency virus [HIV] disease: Secondary | ICD-10-CM | POA: Insufficient documentation

## 2018-11-18 DIAGNOSIS — F17210 Nicotine dependence, cigarettes, uncomplicated: Secondary | ICD-10-CM | POA: Insufficient documentation

## 2018-11-18 DIAGNOSIS — I160 Hypertensive urgency: Secondary | ICD-10-CM

## 2018-11-18 DIAGNOSIS — R000 Tachycardia, unspecified: Secondary | ICD-10-CM

## 2018-11-18 DIAGNOSIS — R42 Dizziness and giddiness: Secondary | ICD-10-CM | POA: Insufficient documentation

## 2018-11-18 DIAGNOSIS — Z79899 Other long term (current) drug therapy: Secondary | ICD-10-CM | POA: Insufficient documentation

## 2018-11-18 LAB — BASIC METABOLIC PANEL
Anion gap: 11 (ref 5–15)
BUN: 7 mg/dL (ref 6–20)
CO2: 24 mmol/L (ref 22–32)
Calcium: 9.1 mg/dL (ref 8.9–10.3)
Chloride: 103 mmol/L (ref 98–111)
Creatinine, Ser: 0.83 mg/dL (ref 0.44–1.00)
GFR calc Af Amer: >60 mL/min (ref >60)
GFR calc non Af Amer: >60 mL/min (ref >60)
Glucose, Bld: 102 mg/dL — ABNORMAL HIGH (ref 70–99)
Potassium: 3.8 mmol/L (ref 3.5–5.1)
Sodium: 138 mmol/L (ref 135–145)

## 2018-11-18 LAB — CBC
HCT: 37.3 % (ref 36.0–46.0)
Hemoglobin: 11.8 g/dL — ABNORMAL LOW (ref 12.0–15.0)
MCH: 29.3 pg (ref 26.0–34.0)
MCHC: 31.6 g/dL (ref 30.0–36.0)
MCV: 92.6 fL (ref 80.0–100.0)
Platelets: 208 10*3/uL (ref 150–400)
RBC: 4.03 MIL/uL (ref 3.87–5.11)
RDW: 13.4 % (ref 11.5–15.5)
WBC: 4.6 10*3/uL (ref 4.0–10.5)
nRBC: 0 % (ref 0.0–0.2)

## 2018-11-18 MED ORDER — AMLODIPINE BESYLATE 5 MG PO TABS: 5.0000 mg | ORAL_TABLET | Freq: Every day | ORAL | 0 refills | Status: DC

## 2018-11-18 NOTE — ED Triage Notes (Signed)
Pt arrives from Infectious disease for hypertension in 200's and pt also reports dizziness for the last 2 weeks.

## 2018-11-18 NOTE — ED Notes (Signed)
Patient verbalizes understanding of discharge instructions. Opportunity for questioning and answers were provided. Armband removed by staff, pt discharged from ED ambulatory.   

## 2018-11-18 NOTE — Telephone Encounter (Signed)
Yes I agree with taking them in the evening. Please remind her however to take her Odefsey and Prezcobix with meal of at least 400 calories to get enough stomach acid to absorb her medication effectively. She can also take the Bactrim at night as well if that is helpful.  I am hopeful that by taking it at night she will sleep through the dizziness.

## 2018-11-18 NOTE — Telephone Encounter (Signed)
Patient called to report that she has been having nausea and feeling dizzy in the mornings after taking her medications. She reports that she is taking it with a meal in the morning. Asked her to try to take it in the evening and also reminded her she is only to take the Bactrim 3 times a week. Also advised her to call us back if that does not help the nausea and we will see what we can do to help her.

## 2018-11-18 NOTE — Discharge Instructions (Signed)
Please follow-up closely with your primary doctor.  Begin taking amlodipine as prescribed.  Please return to the emergency department immediately if you develop any new or worsening symptoms including passing out, weakness on one side of your body, speech difficulty, dizziness so bad you cannot even walk, or any other concerning symptoms.

## 2018-11-18 NOTE — ED Provider Notes (Signed)
[facility] EMERGENCY DEPARTMENT Provider Note   CSN: [id number] Arrival date & time: 11/18/18  1738     History   Chief Complaint Chief Complaint  Patient presents with  . Hypertension    HPI Christine Dawson is a 53 y.o. 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  Patient reports she only has an unsteadiness when she walks.  It is fine at rest.  She denies any other symptoms including speech problems, numbness or tingling, vision changes, weakness, chest pain, shortness of breath, abdominal pain, nausea, vomiting.  Patient does not currently take any antihypertensive agents.  Per chart review, patient is currently being treated for syphilis.  She has also been started on prophylactic Bactrim considering her AIDS status.     HPI  Past Medical History:  Diagnosis Date  . Abnormal Pap smear 08/2010  . HIV infection [facility])     Patient Active Problem List   Diagnosis Date Noted  . AIDS (acquired immune deficiency syndrome) ([place]) 10/30/2018  . Grief reaction 10/30/2018  . Tobacco abuse 11/10/2011  . Hypertension 11/10/2011  . LGSIL of cervix of undetermined significance 01/01/2011  . Human immunodeficiency virus (HIV) disease ([place]) 09/28/2006  . SYPHILIS NOS 09/28/2006  . Alcohol abuse 09/28/2006  . History of hepatitis B 09/28/2006    Past Surgical History:  Procedure Laterality Date  . CESAREAN SECTION  1987     OB History    Gravida  4   Para  4   Term  4   Preterm  0   AB  0   Living  4     SAB  0   TAB  0   Ectopic  0   Multiple  0   Live Births               Home Medications    Prior to Admission medications   Medication Sig Start Date End Date Taking? Authorizing Provider  amLODipine (NORVASC) 5 MG tablet Take 1 tablet (5 mg total) by mouth daily. 11/18/18   Theresa Wedel,  Daven Pinckney M, PA-C  darunavir-cobicistat (PREZCOBIX) 800-150 MG tablet Take 1 tablet by mouth daily with breakfast. Swallow whole. Do NOT crush, break or chew tablets. Take with food. 10/28/18   Harlem Callas, NP  emtricitabine-rilpivir-tenofovir AF (ODEFSEY) 200-25-25 MG TABS tablet Take 1 tablet by mouth daily with breakfast. 10/28/18   Niland Callas, NP  hydrOXYzine (ATARAX/VISTARIL) 10 MG tablet Take 1 tablet (10 mg total) by mouth 3 (three) times daily as needed. Patient not taking: Reported on 06/27/2015 01/13/13   Jeannett Senior, PA-C  sulfamethoxazole-trimethoprim (BACTRIM DS) 800-160 MG tablet Take 1 tablet by mouth 3 (three) times a week. 10/29/18   Neenah Callas, NP    Family History Family History  Problem Relation Age of Onset  . Cancer Mother 8       breast  . Hypertension Mother   . Cancer Other   . Hypertension Sister     Social History Social History   Tobacco Use  . Smoking status: Current Every Day Smoker    Packs/day: 0.50    Types: Cigarettes  . Smokeless tobacco: Never Used  . Tobacco comment: cutting back  Substance Use Topics  . Alcohol use: Yes    Alcohol/week: 21.0 standard drinks    Types: 21  Standard drinks or equivalent per week    Comment: beer  . Drug use: No     Allergies   Patient has no known allergies.   Review of Systems Review of Systems  Constitutional: Negative for chills and fever.  HENT: Negative for facial swelling and sore throat.   Respiratory: Negative for shortness of breath.   Cardiovascular: Negative for chest pain.  Gastrointestinal: Negative for abdominal pain, nausea and vomiting.  Genitourinary: Negative for dysuria.  Musculoskeletal: Negative for back pain.  Skin: Negative for rash and wound.  Neurological: Positive for dizziness. Negative for headaches.  Psychiatric/Behavioral: The patient is not nervous/anxious.      Physical Exam Updated Vital Signs BP (!) 157/96   Pulse 86   Temp 98.6  F (37 C) (Oral)   Resp 16   SpO2 100%   Physical Exam Vitals signs and nursing note reviewed.  Constitutional:      General: She is not in acute distress.    Appearance: She is well-developed. She is not diaphoretic.  HENT:     Head: Normocephalic and atraumatic.     Mouth/Throat:     Pharynx: No oropharyngeal exudate.  Eyes:     General: No scleral icterus.       Right eye: No discharge.        Left eye: No discharge.     Extraocular Movements: Extraocular movements intact.     Right eye: No nystagmus.     Left eye: No nystagmus.     Conjunctiva/sclera: Conjunctivae normal.     Pupils: Pupils are equal, round, and reactive to light.  Neck:     Musculoskeletal: Normal range of motion and neck supple.     Thyroid: No thyromegaly.  Cardiovascular:     Rate and Rhythm: Normal rate and regular rhythm.     Heart sounds: Normal heart sounds. No murmur. No friction rub. No gallop.   Pulmonary:     Effort: Pulmonary effort is normal. No respiratory distress.     Breath sounds: Normal breath sounds. No stridor. No wheezing or rales.  Abdominal:     General: Bowel sounds are normal. There is no distension.     Palpations: Abdomen is soft.     Tenderness: There is no abdominal tenderness. There is no guarding or rebound.  Lymphadenopathy:     Cervical: No cervical adenopathy.  Skin:    General: Skin is warm and dry.     Coloration: Skin is not pale.     Findings: No rash.  Neurological:     Mental Status: She is alert.     Coordination: Coordination normal.     Comments: CN 3-12 intact; normal sensation throughout; 5/5 strength in all 4 extremities; equal bilateral grip strength      ED Treatments / Results  Labs (all labs ordered are listed, but only abnormal results are displayed) Labs Reviewed  BASIC METABOLIC PANEL - Abnormal; Notable for the following components:      Result Value   Glucose, Bld 102 (*)    All other components within normal limits  CBC - Abnormal;  Notable for the following components:   Hemoglobin 11.8 (*)    All other components within normal limits    EKG None  Radiology Ct Head Wo Contrast  Result Date: 11/18/2018 CLINICAL DATA:  53 year old female with dizziness and hypertension. EXAM: CT HEAD WITHOUT CONTRAST TECHNIQUE: Contiguous axial images were obtained from the base of the skull through the vertex without intravenous  contrast. COMPARISON:  None. FINDINGS: Brain: The ventricles and sulci appropriate size for patient's age. The gray-white matter discrimination is preserved. There is no acute intracranial hemorrhage. No mass effect or midline shift. No extra-axial fluid collection. Vascular: No hyperdense vessel or unexpected calcification. Skull: Normal. Negative for fracture or focal lesion. Sinuses/Orbits: No acute finding. Other: None IMPRESSION: Unremarkable noncontrast CT of the brain. Electronically Signed   By: Elgie Collard M.D.   On: 11/18/2018 22:16    Procedures Procedures (including critical care time)  Medications Ordered in ED Medications - No data to display   Initial Impression / Assessment and Plan / ED Course  I have reviewed the triage vital signs and the nursing notes.  Pertinent labs & imaging results that were available during my care of the patient were reviewed by me and considered in my medical decision making (see chart for details).        Patient presenting with intermittent dizziness over the past 2 weeks.  CBC, BMP unremarkable.  CT head is unremarkable.  Patient's blood pressure coming down with rest in the ED.  Will start low-dose amlodipine.  I discussed this with pharmacist Jairo Ben considering possible interactions with HIV medications.  Close follow-up with PCP.  Suspect dizziness could be related to the blood pressure or secondary syphilis, although patient is currently being treated.  She was supposed to have an injection today, but was sent here instead.  She will return  tomorrow.  Recheck blood pressure then.  Patient given return precautions.  She understands and agrees with plan.  Patient vitals stable throughout ED course and discharged in satisfactory condition.  Patient also evaluated by my attending, Dr. Madilyn Hook, who guided the patient's management and agrees with plan.  Final Clinical Impressions(s) / ED Diagnoses   Final diagnoses:  Hypertensive urgency  Dizziness    ED Discharge Orders         Ordered    amLODipine (NORVASC) 5 MG tablet  Daily     11/18/18 2244           Emi Holes, PA-C 11/18/18 2255    Tilden Fossa, MD 11/19/18 0000

## 2018-11-24 ENCOUNTER — Ambulatory Visit (INDEPENDENT_AMBULATORY_CARE_PROVIDER_SITE_OTHER): Payer: Self-pay

## 2018-11-24 ENCOUNTER — Encounter: Payer: Self-pay | Admitting: Infectious Diseases

## 2018-11-24 ENCOUNTER — Other Ambulatory Visit: Payer: Self-pay

## 2018-11-24 ENCOUNTER — Telehealth (INDEPENDENT_AMBULATORY_CARE_PROVIDER_SITE_OTHER): Payer: Self-pay | Admitting: Infectious Diseases

## 2018-11-24 VITALS — BP 156/92 | HR 134

## 2018-11-24 DIAGNOSIS — B20 Human immunodeficiency virus [HIV] disease: Secondary | ICD-10-CM

## 2018-11-24 DIAGNOSIS — A539 Syphilis, unspecified: Secondary | ICD-10-CM

## 2018-11-24 MED ORDER — PENICILLIN G BENZATHINE 1200000 UNIT/2ML IM SUSP
1.2000 10*6.[IU] | Freq: Once | INTRAMUSCULAR | Status: AC
  Administered 2018-11-24: 1.2 10*6.[IU] via INTRAMUSCULAR

## 2018-11-24 MED ORDER — ATENOLOL 25 MG PO TABS: 25.0000 mg | ORAL_TABLET | Freq: Every day | ORAL | 2 refills | Status: DC

## 2018-11-24 MED ORDER — AMLODIPINE BESYLATE 10 MG PO TABS: 10.0000 mg | ORAL_TABLET | Freq: Every day | ORAL | 2 refills | Status: DC

## 2018-11-24 NOTE — Patient Instructions (Signed)
Per Janene Madeira, NP increase you Amlodipine to 10 mg every day. For your current prescription start taking two pills once a day. I will send in a new dose when you pick up the new bottle.  New Blood pressure pill Atenolol 25 mg tablet, once a day.

## 2018-11-24 NOTE — Progress Notes (Signed)
Patient here today for final bicillin injection and blood pressure check.  Blood pressure and pulse elevated during visit. Per Janene Madeira, NP to obtain EKG. EKG reviewed by Janene Madeira, NP and she increased Amlodipine to 10mg  daily and also included Atenolol 25 mg tablet once daily. Patient tolerated bicillin injection well with Chaperone Tammy K. present.  All instructions for new/updated medication changes provided verbally and also printed on the AVS. Colletta Maryland to follow up with patient on Nov 9th.  Eugenia Mcalpine

## 2018-11-26 ENCOUNTER — Encounter: Payer: Self-pay | Admitting: Infectious Diseases

## 2018-11-26 NOTE — Progress Notes (Signed)
Christine Dawson is a 53 y.o. female that recently resumed care for her HIV disease, AIDS+.   She came to clinic for evaluation after she has been experiencing increased dizziness, shaking, nervousness and just "feeling poorly" all day at work. Her job called EMS for evaluation and recommended she go to the ER for formal evaluation however she declined. She showed me a EKG strip they gave her that revealed Sinus Tachycardia with age in determinant old infarction. No ST segment or T-wave changes. She was hypertensive with SBP 180 at that time. She has not been taking any of her blood pressure medications.   Here in clinic today she is shaky, nervous, anxious. She denies any chest pain, pressure, SOB, nausea, vomiting. No headaches or vision changes. HR was tachycardic but regular without murmur/gallop. She is not diaphoretic and skin is dry and well perfused. Pupils equal and reactive. Grip strength intact and equal without any detectable weakness. Breathing is tachy but normal lung sounds in all fields. No accessory muscle use. She is very concerned she is having a heart attack.   Her vitals during the time of our encounter were:  Today's Vitals   11/18/18 1730 11/18/18 1731 11/18/18 1732  BP: (!) 215/111 (!) 163/83 (!) 178/85  Pulse: (!) 116 89 (!) 104  SpO2: 100%  100%   There is no height or weight on file to calculate BMI.   We felt it best that since this has escalated since her initial evaluation by EMS the best course of action for now would be referral to ER. She was monitored by nursing staff and myself until EMS could get to her and taken via stretcher to [facility] hospital for evaluation.    Janene Madeira, MSN, NP-C [facility] for Infectious Disease [email or web address] Pager: [phone] Office: [phone] [place]: [phone]

## 2018-11-26 NOTE — Telephone Encounter (Signed)
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   BP today reveals 180/110 with pulse 136.  I ordered an EKG which revealed sinus tachycardia without any other abnormality.   Upon recheck after resting her BP was 150/98 pulse 128.   She is still having dizzy episodes. She is taking her amlodipine 5 mg as prescribed once a day.   Will increase amlodipine to 10 mg QD and add Atenolol 25 mg QD to provide some heart rate control. May consider switch to metoprolol later if she tolerates.   She will follow up with me in 2 weeks in the office to repeat her BP and check in on dizziness.    Janene Madeira, MSN, NP-C [facility] for [email or web address] [email or web address] Pager: [phone] Office: [phone] [place]: [phone]

## 2018-12-10 ENCOUNTER — Telehealth: Payer: Self-pay

## 2018-12-10 NOTE — Progress Notes (Signed)
Name: Christine Dawson  DOB: January 15, 1966 MRN: [id number] PCP: Patient, No Pcp Per    Patient Active Problem List   Diagnosis Date Noted  . Screening for cervical cancer 12/13/2018  . Palpitations 12/13/2018  . AIDS (acquired immune deficiency syndrome) ([place]) 10/30/2018  . Grief reaction 10/30/2018  . Tobacco abuse 11/10/2011  . Hypertension 11/10/2011  . LGSIL of cervix of undetermined significance 01/01/2011  . Human immunodeficiency virus (HIV) disease ([place]) 09/28/2006  . History of syphilis 09/28/2006  . Alcohol abuse 09/28/2006  . History of hepatitis B 09/28/2006     Brief Narrative:  Christine Dawson is a 53 y.o. female with HIV, +AIDS, Dx 09-2006. CD4 nadir 58 (10-2018) VL 258,000 Jan 2019   HIV Risk: heterosexual  History of OIs: unknown Intake Labs 2014 Hep B sAg (- 2012), sAb (-), cAb (); Hep A (+2012), Hep C (- 2012) Quantiferon () HLA B*5701 (- 2013) G6PD: ()   Previous Regimens: Jorje Guild >> failed with resistance  . Odefsey + Prezcobix   Genotypes: . Cumulative Genotype 02-2017 Nucleoside RTIs  Abacavir - low level resistance Zidovudine - susceptible Emtricitabine - high level resistance Lamivudine - high level resistance Tenofovir - susceptible   Non-Nucleoside RTIs  Doravirine - susceptible Efavirenz - susceptible Etravirine - susceptible Nevirapine - susceptible Rilpivirine - susceptible   Protease Inhibitors  Atazanavir - susceptible Darunavir - susceptible Lopinavir - susceptible   Integrase Inhibitors  Bictegravir - intermediate resistance Dolutegravir - intermediate resistance Elvitegravir - high level resistance Raltegravir - high level resistance    Subjective:  CC: HIVAIDS follow up care. Hypertension follow up after med change.  Dizzy spells have subsided but she still feels shaky from time to time. +palpitations.   HPI: She is starting to feel little bit better.  Shakiness has improved and dizziness has resolved although  she still notices her heart rate beating quite fast at times even with little effort.  She is unable to check her blood pressures at home.  She has been out of work since her ER evaluation 3 and half weeks ago. Until she can feel better to stay work more consistently she is on leave.  She does continue to smoke cigarettes multiple times per day.  She has had increased stress recently with the sudden abrupt death of a very young family member, 55 yo female.  She has noticed more energy and a better appetite since resuming her HIV medications.  She continues to take the Prezcobix +Odefsey +Bactrim daily with food.  She has had no new medications aside from the ones that I am giving her.  She does not take any acid reducing medications.  She is not sexually active.  She has completed her treatment for latent syphilis of unknown duration with 3 injections of Bicillin.  She has no concerns today suggestive of associated opportunistic infection or advancing HIV disease such as fevers, night sweats, weight loss, anorexia, cough, SOB, nausea, vomiting, diarrhea, headache, sensory changes, lymphadenopathy or oral thrush.   Review of Systems  Constitutional: Negative for chills, fever, malaise/fatigue and weight loss.  HENT: Negative for sore throat.   Respiratory: Negative for cough, sputum production and shortness of breath.   Cardiovascular: Positive for palpitations. Negative for chest pain and leg swelling.  Gastrointestinal: Negative for abdominal pain, diarrhea and vomiting.  Musculoskeletal: Negative for joint pain, myalgias and neck pain.  Skin: Negative for rash.  Neurological: Negative for dizziness, weakness and headaches. Tremors: fine shaking at times of hands  b/l.  Psychiatric/Behavioral: Negative for depression and substance abuse. The patient is not nervous/anxious.     Past Medical History:  Diagnosis Date  . Abnormal Pap smear 08/2010  . HIV infection [facility])     Outpatient Medications  Prior to Visit  Medication Sig Dispense Refill  . amLODipine (NORVASC) 10 MG tablet Take 1 tablet (10 mg total) by mouth daily. 30 tablet 2  . darunavir-cobicistat (PREZCOBIX) 800-150 MG tablet Take 1 tablet by mouth daily with breakfast. Swallow whole. Do NOT crush, break or chew tablets. Take with food. 30 tablet 1  . emtricitabine-rilpivir-tenofovir AF (ODEFSEY) 200-25-25 MG TABS tablet Take 1 tablet by mouth daily with breakfast. 30 tablet 1  . atenolol (TENORMIN) 25 MG tablet Take 1 tablet (25 mg total) by mouth daily. 30 tablet 2  . hydrOXYzine (ATARAX/VISTARIL) 10 MG tablet Take 1 tablet (10 mg total) by mouth 3 (three) times daily as needed. (Patient not taking: Reported on 06/27/2015) 30 tablet 0  . sulfamethoxazole-trimethoprim (BACTRIM DS) 800-160 MG tablet Take 1 tablet by mouth 3 (three) times a week. 30 tablet 5   No facility-administered medications prior to visit.      No Known Allergies  Social History   Tobacco Use  . Smoking status: Current Every Day Smoker    Packs/day: 0.50    Types: Cigarettes  . Smokeless tobacco: Never Used  . Tobacco comment: cutting back  Substance Use Topics  . Alcohol use: Yes    Alcohol/week: 21.0 standard drinks    Types: 21 Standard drinks or equivalent per week    Comment: beer  . Drug use: Not Currently    Social History   Substance and Sexual Activity  Sexual Activity Not Currently   Comment: refused condom 12/2018     Objective:   Vitals:   12/13/18 0919  BP: (!) 150/83  Pulse: (!) 112  Temp: (!) 97.4 F (36.3 C)  TempSrc: Oral  Weight: 111 lb (50.3 kg)   Body mass index is 19.66 kg/m.  Physical Exam Exam conducted with a chaperone present.  Constitutional:      Comments: Thin but well-appearing  HENT:     Mouth/Throat:     Mouth: No oral lesions.     Dentition: No dental abscesses.  Cardiovascular:     Rate and Rhythm: Regular rhythm. Tachycardia present.     Heart sounds: Normal heart sounds. No murmur.   Pulmonary:     Effort: Pulmonary effort is normal.     Breath sounds: Normal breath sounds.  Abdominal:     General: There is no distension.     Palpations: Abdomen is soft.     Tenderness: There is no abdominal tenderness.  Genitourinary:    General: Normal vulva.     Vagina: Normal. No vaginal discharge, bleeding or lesions.     Cervix: Cervical bleeding (light with brushing) present. No cervical motion tenderness.     Uterus: Normal.      Adnexa: Right adnexa normal and left adnexa normal.       Right: No tenderness.         Left: No tenderness.    Musculoskeletal: Normal range of motion.        General: No tenderness.  Lymphadenopathy:     Cervical: No cervical adenopathy.  Skin:    General: Skin is warm and dry.     Findings: No rash.  Neurological:     Mental Status: She is alert and oriented to person, place, and  time.  Psychiatric:        Judgment: Judgment normal.     Lab Results Lab Results  Component Value Date   WBC 4.6 11/18/2018   HGB 11.8 (L) 11/18/2018   HCT 37.3 11/18/2018   MCV 92.6 11/18/2018   PLT 208 11/18/2018    Lab Results  Component Value Date   CREATININE 0.83 11/18/2018   BUN 7 11/18/2018   NA 138 11/18/2018   K 3.8 11/18/2018   CL 103 11/18/2018   CO2 24 11/18/2018    Lab Results  Component Value Date   ALT 9 10/28/2018   AST 22 10/28/2018   ALKPHOS 58 06/16/2016   BILITOT 0.5 10/28/2018    Lab Results  Component Value Date   CHOL 134 02/04/2017   HDL 68 02/04/2017   LDLCALC 45 02/04/2017   TRIG 127 02/04/2017   CHOLHDL 2.0 02/04/2017   HIV 1 RNA Quant (copies/mL)  Date Value  10/28/2018 465,000 (H)  02/04/2017 258,000 (H)  06/16/2016 <20 DETECTED (A)   CD4 T Cell Abs (/uL)  Date Value  10/28/2018 54 (L)  02/04/2017 160 (L)  06/16/2016 600     Assessment & Plan:   Problem List Items Addressed This Visit      Unprioritized   Human immunodeficiency virus (HIV) disease ([place]) (Chronic)    Hx of MDR HIV - She  has had good reported adherence to salvage regimen of Odefsey + Prezcobix. Will repeat blood work today to follow for treatment response. She has had her flu shot last visit. Will see how her CD4 count responds for timing of Prevnar - would like for level to be closer to 200 ideally.  She will continue Bactrim today once daily with last known CD4 53. Discussed duration of this may be 4-6 more months or longer.   Return in about 2 months (around 02/12/2019).       Relevant Orders   HIV 1 RNA quant-no reflex-bld   T-helper cell (CD4)- ([facility] clinic only)   COMPLETE METABOLIC PANEL WITH GFR   Hypertension (Chronic)    Her HR is slightly improved and no longer dizzy. BP still above target. Will increase Atenolol to 50 mg QD and continue Amlodipine 10 mg QD. We discussed referral to Internal Medicine team to help - information provided today to help with making an appointment.  Will have her back in 2 moths to repeat or sooner if she does not feel well.       Relevant Medications   atenolol (TENORMIN) 50 MG tablet   History of syphilis    Treated for latent syphilis of unknown duration.       Tobacco abuse    Counseled to quit to help with BP/HR control. She is pre-contemplative.       AIDS (acquired immune deficiency syndrome) ([place]) - Primary    No signs of opportunistic infection on exam today.       Screening for cervical cancer    Normal pelvic exam. Normal bimanual exam. History of LSIL.  Cervical brushing collected for cytology and HPV.  Discussed recommended screening interval for women living with HIV disease meant to be lifelong and at an interval of Q1-3 years pending results. Acceptable to space out Q3y with 3 consecutively normal exams. Further recommendations for Christine Dawson to follow today's results.  Results will be communicated to the patient via telephone call.        Relevant Orders   Cytology - PAP( [place])  Palpitations    Other Visit Diagnoses    Routine  screening for STI (sexually transmitted infection)       Relevant Orders   Cervicovaginal ancillary only( [place])   Breast cancer screening by mammogram       Relevant Orders   MM [place], MSN, NP-C [facility] for Infectious [place] Pager: [phone] Office: [phone]  12/13/18  3:22 PM

## 2018-12-10 NOTE — Telephone Encounter (Signed)
COVID-19 Pre-Screening Questions:12/10/18  Do you currently have a fever (>100 F), chills or unexplained body aches? NO  . Are you currently experiencing new cough, shortness of breath, sore throat, runny nose? NO .  Have you recently travelled outside the state of [place] in the last 14 days? NO .  Have you been in contact with someone that is currently pending confirmation of Covid19 testing or has been confirmed to have the [place] virus?  NO  **If the patient answers NO to ALL questions -  advise the patient to please call the clinic before coming to the office should any symptoms develop.

## 2018-12-13 ENCOUNTER — Ambulatory Visit (INDEPENDENT_AMBULATORY_CARE_PROVIDER_SITE_OTHER): Payer: Self-pay | Admitting: Infectious Diseases

## 2018-12-13 ENCOUNTER — Encounter: Payer: Self-pay | Admitting: Infectious Diseases

## 2018-12-13 ENCOUNTER — Other Ambulatory Visit: Payer: Self-pay

## 2018-12-13 VITALS — BP 150/83 | HR 112 | Temp 97.4°F | Wt 111.0 lb

## 2018-12-13 DIAGNOSIS — Z8619 Personal history of other infectious and parasitic diseases: Secondary | ICD-10-CM

## 2018-12-13 DIAGNOSIS — Z1231 Encounter for screening mammogram for malignant neoplasm of breast: Secondary | ICD-10-CM

## 2018-12-13 DIAGNOSIS — Z113 Encounter for screening for infections with a predominantly sexual mode of transmission: Secondary | ICD-10-CM

## 2018-12-13 DIAGNOSIS — I10 Essential (primary) hypertension: Secondary | ICD-10-CM

## 2018-12-13 DIAGNOSIS — Z124 Encounter for screening for malignant neoplasm of cervix: Secondary | ICD-10-CM

## 2018-12-13 DIAGNOSIS — B20 Human immunodeficiency virus [HIV] disease: Secondary | ICD-10-CM

## 2018-12-13 DIAGNOSIS — Z0000 Encounter for general adult medical examination without abnormal findings: Secondary | ICD-10-CM | POA: Insufficient documentation

## 2018-12-13 DIAGNOSIS — Z720 Tobacco use: Secondary | ICD-10-CM

## 2018-12-13 DIAGNOSIS — R002 Palpitations: Secondary | ICD-10-CM

## 2018-12-13 MED ORDER — ATENOLOL 50 MG PO TABS: 50.0000 mg | ORAL_TABLET | Freq: Every day | ORAL | 1 refills | Status: DC

## 2018-12-13 NOTE — Assessment & Plan Note (Signed)
Counseled to quit to help with BP/HR control. She is pre-contemplative.

## 2018-12-13 NOTE — Assessment & Plan Note (Signed)
Normal pelvic exam. Normal bimanual exam. History of LSIL.  Cervical brushing collected for cytology and HPV.  Discussed recommended screening interval for women living with HIV disease meant to be lifelong and at an interval of Q1-3 years pending results. Acceptable to space out Q3y with 3 consecutively normal exams. Further recommendations for Rether D Lund to follow today's results.  Results will be communicated to the patient via telephone call.

## 2018-12-13 NOTE — Assessment & Plan Note (Signed)
Hx of MDR HIV - She has had good reported adherence to salvage regimen of Odefsey + Prezcobix. Will repeat blood work today to follow for treatment response. She has had her flu shot last visit. Will see how her CD4 count responds for timing of Prevnar - would like for level to be closer to 200 ideally.  She will continue Bactrim today once daily with last known CD4 53. Discussed duration of this may be 4-6 more months or longer.   Return in about 2 months (around 02/12/2019).

## 2018-12-13 NOTE — Assessment & Plan Note (Signed)
No signs of opportunistic infection on exam today.

## 2018-12-13 NOTE — Assessment & Plan Note (Signed)
Her HR is slightly improved and no longer dizzy. BP still above target. Will increase Atenolol to 50 mg QD and continue Amlodipine 10 mg QD. We discussed referral to Internal Medicine team to help - information provided today to help with making an appointment.  Will have her back in 2 moths to repeat or sooner if she does not feel well.

## 2018-12-13 NOTE — Patient Instructions (Addendum)
Nice to see you!  Please continue taking your Odefsey and Prezcobix with food every day as you are now. Same time of day works best!  No heartbburn medications while taking these (Pepcid, Prilosec, Tums, etc)  Please for your blood pressure continue taking your Amlodipine once a day.  For your Atenolol please start taking two pills once a day until you pick up your new bottle.   I would also strongly consider stopping cigarettes - this can make your blood pressure and heart rate increased.   I would also like to get you in to see a Primary Care provider to help with your blood pressure/heart rate management. I think this would be best for your care to have another person on your health care team.   Otherwise I would like to see you back in 2 months so we can follow you closely, repeat your blood work and blood pressure.   If you would like I would love to set you up with Marcie Bal our counselor to work on some stress management and grief sessions for your sudden loss. She is completely free for you and I think you would really enjoy meeting with her.    Have a safe and happy Thanksgiving!

## 2018-12-13 NOTE — Assessment & Plan Note (Signed)
Treated for latent syphilis of unknown duration.

## 2018-12-14 LAB — CERVICOVAGINAL ANCILLARY ONLY
Chlamydia: NEGATIVE
Comment: NEGATIVE
Comment: NORMAL
Neisseria Gonorrhea: NEGATIVE

## 2018-12-14 LAB — T-HELPER CELL (CD4) - (RCID CLINIC ONLY)
CD4 % Helper T Cell: 14 % — ABNORMAL LOW (ref 33–65)
CD4 T Cell Abs: 166 /uL — ABNORMAL LOW (ref 400–1790)

## 2018-12-16 LAB — COMPLETE METABOLIC PANEL WITH GFR
AG Ratio: 1 (calc) (ref 1.0–2.5)
ALT: 7 U/L (ref 6–29)
AST: 15 U/L (ref 10–35)
Albumin: 3.8 g/dL (ref 3.6–5.1)
Alkaline phosphatase (APISO): 60 U/L (ref 37–153)
BUN: 14 mg/dL (ref 7–25)
CO2: 27 mmol/L (ref 20–32)
Calcium: 9.3 mg/dL (ref 8.6–10.4)
Chloride: 105 mmol/L (ref 98–110)
Creat: 0.69 mg/dL (ref 0.50–1.05)
GFR, Est African American: 114 mL/min/{1.73_m2} (ref >=60)
GFR, Est Non African American: 99 mL/min/{1.73_m2} (ref >=60)
Globulin: 4 g/dL (calc) — ABNORMAL HIGH (ref 1.9–3.7)
Glucose, Bld: 118 mg/dL — ABNORMAL HIGH (ref 65–99)
Potassium: 4.4 mmol/L (ref 3.5–5.3)
Sodium: 139 mmol/L (ref 135–146)
Total Bilirubin: 0.3 mg/dL (ref 0.2–1.2)
Total Protein: 7.8 g/dL (ref 6.1–8.1)

## 2018-12-16 LAB — HIV-1 RNA QUANT-NO REFLEX-BLD
HIV 1 RNA Quant: 1200 copies/mL — ABNORMAL HIGH
HIV-1 RNA Quant, Log: 3.08 Log copies/mL — ABNORMAL HIGH

## 2018-12-16 LAB — CYTOLOGY - PAP
Comment: NEGATIVE
Comment: NEGATIVE
Comment: NEGATIVE
Diagnosis: NEGATIVE
HPV 16: NEGATIVE
HPV 18 / 45: NEGATIVE
High risk HPV: POSITIVE — AB

## 2018-12-16 NOTE — Progress Notes (Signed)
Negative cytology, + HPV (16/18/45 neg).  Recommend repeating in 1 year.

## 2019-02-15 ENCOUNTER — Ambulatory Visit: Payer: Self-pay | Admitting: Infectious Diseases

## 2019-03-23 ENCOUNTER — Other Ambulatory Visit: Payer: Self-pay

## 2019-03-23 DIAGNOSIS — B20 Human immunodeficiency virus [HIV] disease: Secondary | ICD-10-CM

## 2019-03-23 DIAGNOSIS — I10 Essential (primary) hypertension: Secondary | ICD-10-CM

## 2019-03-23 MED ORDER — ATENOLOL 50 MG PO TABS: 50.0000 mg | ORAL_TABLET | Freq: Every day | ORAL | 1 refills | Status: DC

## 2019-03-23 MED ORDER — PREZCOBIX 800-150 MG PO TABS: 1.0000 | ORAL_TABLET | Freq: Every day | ORAL | 1 refills | Status: DC

## 2019-03-23 MED ORDER — ODEFSEY 200-25-25 MG PO TABS: 1.0000 | ORAL_TABLET | Freq: Every day | ORAL | 1 refills | Status: DC

## 2019-03-23 MED ORDER — SULFAMETHOXAZOLE-TRIMETHOPRIM 800-160 MG PO TABS: 1.0000 | ORAL_TABLET | ORAL | 1 refills | Status: DC

## 2019-04-14 ENCOUNTER — Other Ambulatory Visit: Payer: Self-pay | Admitting: Infectious Diseases

## 2019-04-18 ENCOUNTER — Ambulatory Visit (INDEPENDENT_AMBULATORY_CARE_PROVIDER_SITE_OTHER): Payer: Self-pay | Admitting: Infectious Diseases

## 2019-04-18 ENCOUNTER — Encounter: Payer: Self-pay | Admitting: Infectious Diseases

## 2019-04-18 ENCOUNTER — Other Ambulatory Visit: Payer: Self-pay

## 2019-04-18 VITALS — BP 145/80 | HR 66 | Temp 97.9°F | Ht 63.0 in | Wt 124.0 lb

## 2019-04-18 DIAGNOSIS — B20 Human immunodeficiency virus [HIV] disease: Secondary | ICD-10-CM

## 2019-04-18 MED ORDER — PREZCOBIX 800-150 MG PO TABS: 1.0000 | ORAL_TABLET | Freq: Every day | ORAL | 5 refills | Status: DC

## 2019-04-18 MED ORDER — ODEFSEY 200-25-25 MG PO TABS: 1.0000 | ORAL_TABLET | Freq: Every day | ORAL | 5 refills | Status: DC

## 2019-04-18 MED ORDER — SULFAMETHOXAZOLE-TRIMETHOPRIM 800-160 MG PO TABS: 1.0000 | ORAL_TABLET | ORAL | 2 refills | Status: DC

## 2019-04-18 NOTE — Patient Instructions (Addendum)
So nice to see you!   Please continue your Odefsey and Prezcobix once a day with food as you are.   Please stop by the lab on your way out  Please stop by to meet with our financial team before you leave to renew your ADAP.   Please call to schedule your COVID vaccine @ [phone]    Will need to see you back in 3-4 months with labs 2 weeks before if you can please.   Pap smear due in November 2021.

## 2019-04-18 NOTE — Progress Notes (Signed)
Subjective:    Patient ID: Christine Dawson, female    DOB: [birth year], 54 y.o.   MRN: [id number]  HPI   Patient is reporting some swelling in BL legs. She has a history of HTN and is on Norvasc 10mg  daily and Atenolol 50mg  daily, her BP today is 145/80. She thinks the swelling might be related to being on her feet at work.  She reports taking her Odefsey and Prezcobix everyday, and have no issues taking them. Need to redo ADAP and is considering receiving the COVID vaccine. She reports smoking daily and drinking 3 beers a day. She denies illicit drug . Denies curent sexual activities and wants condoms.     Review of Systems  Constitutional: Negative for appetite change, chills, fatigue, fever and unexpected weight change.  HENT: Negative for mouth sores and sore throat.   Eyes: Negative for pain, discharge and redness.  Respiratory: Negative for cough, shortness of breath and wheezing.   Cardiovascular: Positive for leg swelling. Negative for chest pain and palpitations.  Gastrointestinal: Negative for abdominal pain, diarrhea, nausea and vomiting.  Endocrine: Negative for cold intolerance and heat intolerance.  Musculoskeletal: Negative for gait problem and joint swelling.  Skin: Negative for rash.  Neurological: Negative for dizziness, weakness, light-headedness and headaches.  Psychiatric/Behavioral: Negative for behavioral problems and sleep disturbance.       Objective:   Physical Exam Vitals reviewed.  Constitutional:      Appearance: Normal appearance.  HENT:     Head: Normocephalic.     Nose: Nose normal.  Eyes:     Extraocular Movements: Extraocular movements intact.     Pupils: Pupils are equal, round, and reactive to light.  Cardiovascular:     Rate and Rhythm: Normal rate and regular rhythm.     Heart sounds: Normal heart sounds.  Pulmonary:     Effort: Pulmonary effort is normal.     Breath sounds: Normal breath sounds.  Abdominal:     General: Bowel sounds  are normal.  Musculoskeletal:        General: Normal range of motion.     Cervical back: Normal range of motion.     Right lower leg: No edema.     Left lower leg: No edema.  Skin:    General: Skin is warm and dry.  Neurological:     Mental Status: She is alert and oriented to person, place, and time.  Psychiatric:        Mood and Affect: Mood normal.        Behavior: Behavior normal.        Thought Content: Thought content normal.        Judgment: Judgment normal.      Today's Vitals   04/18/19 1025  BP: (!) 145/80  Pulse: 66  Temp: 97.9 F (36.6 C)  SpO2: 100%  Weight: 124 lb (56.2 kg)  Height: 5\' 3"  (1.6 m)   Body mass index is 21.97 kg/m.   Lab Results  Component Value Date   HIV1RNAQUANT 1,200 (H) 12/13/2018   HIV1RNAQUANT 465,000 (H) 10/28/2018   HIV1RNAQUANT 258,000 (H) 02/04/2017       Assessment & Plan:   Problem List Items Addressed This Visit      Other   Human immunodeficiency virus (HIV) disease (HCC) - Primary (Chronic)    Last HIV viral load and CD4 count in 12/2019 was 1,200 and 166. Continue on Prezcobix and Odefsey. Today will check labs for HIV viral load, CD4  count and CBC with GFR.       Relevant Medications   darunavir-cobicistat (PREZCOBIX) 800-150 MG tablet   emtricitabine-rilpivir-tenofovir AF (ODEFSEY) 200-25-25 MG TABS tablet   sulfamethoxazole-trimethoprim (BACTRIM DS) 800-160 MG tablet   Other Relevant Orders   HIV-1 RNA quant-no reflex-bld   T-helper cell (CD4)- ([facility] clinic only)   HIV-1 RNA quant-no reflex-bld   T-helper cell (CD4)- ([facility] clinic only)   COMPLETE METABOLIC PANEL WITH GFR   AIDS (acquired immune deficiency syndrome) (HCC)    Last HIV viral load and CD4 count in 12/2018 was 1,200 and 166. Continue taking Prezcobix, Odefsey, and Bactrim. Will check lab work today.       Relevant Medications   darunavir-cobicistat (PREZCOBIX) 800-150 MG tablet   emtricitabine-rilpivir-tenofovir AF (ODEFSEY) 200-25-25 MG TABS  tablet   sulfamethoxazole-trimethoprim (BACTRIM DS) 800-160 MG tablet    Other Visit Diagnoses    Human immunodeficiency virus (HIV) disease (HCC)       Relevant Medications   darunavir-cobicistat (PREZCOBIX) 800-150 MG tablet   emtricitabine-rilpivir-tenofovir AF (ODEFSEY) 200-25-25 MG TABS tablet   sulfamethoxazole-trimethoprim (BACTRIM DS) 800-160 MG tablet

## 2019-04-18 NOTE — Assessment & Plan Note (Signed)
Last HIV viral load and CD4 count in 12/2018 was 1,200 and 166. Continue taking Prezcobix, Odefsey, and Bactrim. Will check lab work today.

## 2019-04-18 NOTE — Assessment & Plan Note (Signed)
Last HIV viral load and CD4 count in 12/2019 was 1,200 and 166. Continue on Prezcobix and Odefsey. Today will check labs for HIV viral load, CD4 count and CBC with GFR.

## 2019-04-19 LAB — T-HELPER CELL (CD4) - (RCID CLINIC ONLY)
CD4 % Helper T Cell: 16 % — ABNORMAL LOW (ref 33–65)
CD4 T Cell Abs: 204 /uL — ABNORMAL LOW (ref 400–1790)

## 2019-04-20 LAB — HIV-1 RNA QUANT-NO REFLEX-BLD
HIV 1 RNA Quant: 104 copies/mL — ABNORMAL HIGH
HIV-1 RNA Quant, Log: 2.02 Log copies/mL — ABNORMAL HIGH

## 2019-04-21 ENCOUNTER — Telehealth: Payer: Self-pay | Admitting: Infectious Diseases

## 2019-04-21 NOTE — Telephone Encounter (Signed)
Left non-specific voicemail to patient requesting call back to discuss results of recent tests.   Christine Dawson's viral load is much better - now on ly 104 copies. Would like to encourage her to continue taking her Bactrim, Odefsey and Prezcobix every single day with food. Her CD4 count is up to 204 - this tells [place] that she has shown some signs of recovery.   Happy for her!

## 2019-05-12 ENCOUNTER — Other Ambulatory Visit: Payer: Self-pay | Admitting: Infectious Diseases

## 2019-05-12 DIAGNOSIS — I10 Essential (primary) hypertension: Secondary | ICD-10-CM

## 2019-07-29 ENCOUNTER — Other Ambulatory Visit: Payer: Self-pay | Admitting: Infectious Diseases

## 2019-07-29 DIAGNOSIS — I10 Essential (primary) hypertension: Secondary | ICD-10-CM

## 2019-08-10 ENCOUNTER — Other Ambulatory Visit: Payer: Self-pay

## 2019-08-10 ENCOUNTER — Ambulatory Visit: Payer: Self-pay

## 2019-08-10 DIAGNOSIS — B20 Human immunodeficiency virus [HIV] disease: Secondary | ICD-10-CM

## 2019-08-11 LAB — T-HELPER CELL (CD4) - (RCID CLINIC ONLY)
CD4 % Helper T Cell: 13 % — ABNORMAL LOW (ref 33–65)
CD4 T Cell Abs: 180 /uL — ABNORMAL LOW (ref 400–1790)

## 2019-08-15 LAB — HIV-1 RNA QUANT-NO REFLEX-BLD
HIV 1 RNA Quant: 2260 copies/mL — ABNORMAL HIGH
HIV-1 RNA Quant, Log: 3.35 Log copies/mL — ABNORMAL HIGH

## 2019-08-15 LAB — COMPLETE METABOLIC PANEL WITH GFR
AG Ratio: 1.3 (calc) (ref 1.0–2.5)
ALT: 9 U/L (ref 6–29)
AST: 18 U/L (ref 10–35)
Albumin: 4.1 g/dL (ref 3.6–5.1)
Alkaline phosphatase (APISO): 77 U/L (ref 37–153)
BUN: 15 mg/dL (ref 7–25)
CO2: 24 mmol/L (ref 20–32)
Calcium: 9.4 mg/dL (ref 8.6–10.4)
Chloride: 107 mmol/L (ref 98–110)
Creat: 0.99 mg/dL (ref 0.50–1.05)
GFR, Est African American: 74 mL/min/{1.73_m2} (ref >=60)
GFR, Est Non African American: 64 mL/min/{1.73_m2} (ref >=60)
Globulin: 3.1 g/dL (calc) (ref 1.9–3.7)
Glucose, Bld: 98 mg/dL (ref 65–99)
Potassium: 4.7 mmol/L (ref 3.5–5.3)
Sodium: 138 mmol/L (ref 135–146)
Total Bilirubin: 0.3 mg/dL (ref 0.2–1.2)
Total Protein: 7.2 g/dL (ref 6.1–8.1)

## 2019-08-29 ENCOUNTER — Encounter: Payer: Self-pay | Admitting: Infectious Diseases

## 2019-09-06 ENCOUNTER — Encounter: Payer: Self-pay | Admitting: Infectious Diseases

## 2019-10-03 ENCOUNTER — Encounter: Payer: Self-pay | Admitting: Infectious Diseases

## 2019-10-03 ENCOUNTER — Other Ambulatory Visit: Payer: Self-pay

## 2019-10-03 ENCOUNTER — Ambulatory Visit (INDEPENDENT_AMBULATORY_CARE_PROVIDER_SITE_OTHER): Payer: Self-pay | Admitting: Infectious Diseases

## 2019-10-03 VITALS — BP 153/81 | HR 86 | Temp 98.0°F | Wt 124.0 lb

## 2019-10-03 DIAGNOSIS — F1010 Alcohol abuse, uncomplicated: Secondary | ICD-10-CM

## 2019-10-03 DIAGNOSIS — I10 Essential (primary) hypertension: Secondary | ICD-10-CM

## 2019-10-03 DIAGNOSIS — B20 Human immunodeficiency virus [HIV] disease: Secondary | ICD-10-CM

## 2019-10-03 NOTE — Assessment & Plan Note (Signed)
Exacerbated by current grief reaction. Will help to arrange follow up with our counselor.

## 2019-10-03 NOTE — Progress Notes (Signed)
Name: Christine Dawson  DOB: December 06, 1965 MRN: [id number] PCP: Patient, No Pcp Per    Patient Active Problem List   Diagnosis Date Noted  . Screening for cervical cancer 12/13/2018  . Palpitations 12/13/2018  . AIDS (acquired immune deficiency syndrome) ([place]) 10/30/2018  . Grief reaction 10/30/2018  . Tobacco abuse 11/10/2011  . Hypertension 11/10/2011  . LGSIL of cervix of undetermined significance 01/01/2011  . Human immunodeficiency virus (HIV) disease ([place]) 09/28/2006  . History of syphilis 09/28/2006  . Alcohol abuse 09/28/2006  . History of hepatitis B 09/28/2006     Brief Narrative:  Christine Dawson is a 54 y.o. female with HIV, +AIDS, Dx 09-2006. CD4 nadir 58 (10-2018) VL 258,000 Jan 2019   HIV Risk: heterosexual  History of OIs: unknown Intake Labs 2014 Hep B sAg (- 2012), sAb (-), cAb (); Hep A (+2012), Hep C (- 2012) Quantiferon () HLA B*5701 (- 2013) G6PD: ()   Previous Regimens: Jorje Guild >> failed with resistance  . Odefsey + Prezcobix   Genotypes: . Cumulative Genotype 02-2017 Nucleoside RTIs  Abacavir - low level resistance Zidovudine - susceptible Emtricitabine - high level resistance Lamivudine - high level resistance Tenofovir - susceptible   Non-Nucleoside RTIs  Doravirine - susceptible Efavirenz - susceptible Etravirine - susceptible Nevirapine - susceptible Rilpivirine - susceptible   Protease Inhibitors  Atazanavir - susceptible Darunavir - susceptible Lopinavir - susceptible   Integrase Inhibitors  Bictegravir - intermediate resistance Dolutegravir - intermediate resistance Elvitegravir - high level resistance Raltegravir - high level resistance    Subjective:   Chief Complaint  Patient presents with  . Follow-up    lost son 8/12      HPI: Christine Dawson is here for follow up for routine HIV care. She unexpectedly lost her son recently and she is still grieving. Needs to go back to work tomorrow but not ready emotionally. She has  support with her family and other children but has had some challenges with alcohol relapse during high stress/grief.   She has continued to take her Odefsey and Prezcobix once a day as well as her blood pressure medications. She has not missed any doses of medication over the last month.  Overall energy is better. Dizzy spells are gone. She has put on about 10 lbs and feels better.     Review of Systems  Constitutional: Negative for chills, fever, malaise/fatigue and weight loss.  HENT: Negative for sore throat.   Respiratory: Negative for cough, sputum production and shortness of breath.   Cardiovascular: Negative for chest pain, palpitations and leg swelling.  Gastrointestinal: Negative for abdominal pain, diarrhea and vomiting.  Musculoskeletal: Negative for joint pain, myalgias and neck pain.  Skin: Negative for rash.  Neurological: Negative for dizziness, weakness and headaches.  Psychiatric/Behavioral: Negative for depression and substance abuse. The patient is not nervous/anxious.     Past Medical History:  Diagnosis Date  . Abnormal Pap smear 08/2010  . HIV infection [facility])     Outpatient Medications Prior to Visit  Medication Sig Dispense Refill  . amLODipine (NORVASC) 10 MG tablet TAKE 1 TABLET(10 MG) BY MOUTH DAILY 30 tablet 4  . atenolol (TENORMIN) 50 MG tablet TAKE 1 TABLET(50 MG) BY MOUTH DAILY 30 tablet 4  . darunavir-cobicistat (PREZCOBIX) 800-150 MG tablet Take 1 tablet by mouth daily with breakfast. Swallow whole. Do NOT crush, break or chew tablets. Take with food. 30 tablet 5  . emtricitabine-rilpivir-tenofovir AF (ODEFSEY) 200-25-25 MG TABS tablet Take 1 tablet by  mouth daily with breakfast. 30 tablet 5  . sulfamethoxazole-trimethoprim (BACTRIM DS) 800-160 MG tablet Take 1 tablet by mouth 3 (three) times a week. 30 tablet 2  . hydrOXYzine (ATARAX/VISTARIL) 10 MG tablet Take 1 tablet (10 mg total) by mouth 3 (three) times daily as needed. 30 tablet 0   No  facility-administered medications prior to visit.     No Known Allergies  Social History   Tobacco Use  . Smoking status: Current Every Day Smoker    Packs/day: 0.50    Types: Cigarettes  . Smokeless tobacco: Never Used  . Tobacco comment: cutting back  Substance Use Topics  . Alcohol use: Yes    Alcohol/week: 21.0 standard drinks    Types: 21 Standard drinks or equivalent per week    Comment: beer  . Drug use: Not Currently    Social History   Substance and Sexual Activity  Sexual Activity Not Currently   Comment: refused condom 12/2018     Objective:   Vitals:   10/03/19 1058  BP: (!) 153/81  Pulse: 86  Temp: 98 F (36.7 C)  SpO2: 100%  Weight: 124 lb (56.2 kg)   Body mass index is 21.97 kg/m.  Physical Exam Constitutional:      Appearance: She is well-developed.     Comments: Seated comfortably in chair.   HENT:     Mouth/Throat:     Mouth: No oral lesions.     Dentition: Normal dentition. No dental abscesses.     Pharynx: No oropharyngeal exudate.  Cardiovascular:     Rate and Rhythm: Normal rate and regular rhythm.     Heart sounds: Normal heart sounds.  Pulmonary:     Effort: Pulmonary effort is normal.     Breath sounds: Normal breath sounds.  Abdominal:     General: There is no distension.     Palpations: Abdomen is soft.     Tenderness: There is no abdominal tenderness.  Lymphadenopathy:     Cervical: No cervical adenopathy.  Skin:    General: Skin is warm and dry.     Findings: No rash.  Neurological:     Mental Status: She is alert and oriented to person, place, and time.  Psychiatric:        Judgment: Judgment normal.     Comments: Tearful today with discussions about her son     Lab Results Lab Results  Component Value Date   WBC 4.6 11/18/2018   HGB 11.8 (L) 11/18/2018   HCT 37.3 11/18/2018   MCV 92.6 11/18/2018   PLT 208 11/18/2018    Lab Results  Component Value Date   CREATININE 0.99 08/10/2019   BUN 15 08/10/2019    NA 138 08/10/2019   K 4.7 08/10/2019   CL 107 08/10/2019   CO2 24 08/10/2019    Lab Results  Component Value Date   ALT 9 08/10/2019   AST 18 08/10/2019   ALKPHOS 58 06/16/2016   BILITOT 0.3 08/10/2019    Lab Results  Component Value Date   CHOL 134 02/04/2017   HDL 68 02/04/2017   LDLCALC 45 02/04/2017   TRIG 127 02/04/2017   CHOLHDL 2.0 02/04/2017   HIV 1 RNA Quant (copies/mL)  Date Value  08/10/2019 2,260 (H)  04/18/2019 104 (H)  12/13/2018 1,200 (H)   CD4 T Cell Abs (/uL)  Date Value  08/10/2019 180 (L)  04/18/2019 204 (L)  12/13/2018 166 (L)     Assessment & Plan:   Problem  List Items Addressed This Visit      Unprioritized   Hypertension (Chronic)    BP Readings from Last 3 Encounters:  10/03/19 (!) 153/81  04/18/19 (!) 145/80  12/13/18 (!) 150/83   Will see how she is at next appointment before making any further titrations. Will try to bridge to primary care as well.       Alcohol abuse    Exacerbated by current grief reaction. Will help to arrange follow up with our counselor.       AIDS (acquired immune deficiency syndrome) ([place]) - Primary    She still has a significant viremia > 2000 copies. She says she is taking her medications but in conversation I am not sure she is taking with food. I asked her to please take with largest meal of the day to ensure absorption.  She has no findings of advancing disease today. Feels good overall and improved with general health and nutrition. CD4 still just under 200. If VL still significantly elevated will put her back on bactrim for 3 months.   Will check genotype to ensure no new mutations to worry about that would warrant further medication changes. Will call her with results and change as indicated; otherwise she will return in 2-3 months to check in again.  COVID vaccine received. Recommend flu shot in the fall.  Up to date on ADAP.       Relevant Orders   HIV RNA, RTPCR W/R GT (RTI, PI,INT)       Janene Madeira, MSN, NP-C [facility] [place] Pager: [phone] Office: [phone]  10/03/19  3:28 PM

## 2019-10-03 NOTE — Patient Instructions (Addendum)
Nice to see you today.   Please continue your medications - Odefsey and Prezcobix with a full meal. This helps make sure the medication will absorb properly.   Do not use any over the counter or prescription acid reducing medications   Please stop by the lab on your way out.   Please come back in 3 months

## 2019-10-03 NOTE — Assessment & Plan Note (Signed)
BP Readings from Last 3 Encounters:  10/03/19 (!) 153/81  04/18/19 (!) 145/80  12/13/18 (!) 150/83   Will see how she is at next appointment before making any further titrations. Will try to bridge to primary care as well.

## 2019-10-03 NOTE — Assessment & Plan Note (Addendum)
She still has a significant viremia > 2000 copies. She says she is taking her medications but in conversation I am not sure she is taking with food. I asked her to please take with largest meal of the day to ensure absorption.  She has no findings of advancing disease today. Feels good overall and improved with general health and nutrition. CD4 still just under 200. If VL still significantly elevated will put her back on bactrim for 3 months.   Will check genotype to ensure no new mutations to worry about that would warrant further medication changes. Will call her with results and change as indicated; otherwise she will return in 2-3 months to check in again.  COVID vaccine received. Recommend flu shot in the fall.  Up to date on ADAP.

## 2019-10-11 ENCOUNTER — Ambulatory Visit: Payer: Self-pay

## 2019-10-18 LAB — HIV RNA, RTPCR W/R GT (RTI, PI,INT)
HIV 1 RNA Quant: 6590 copies/mL — ABNORMAL HIGH
HIV-1 RNA Quant, Log: 3.82 Log copies/mL — ABNORMAL HIGH

## 2019-10-18 LAB — HIV-1 INTEGRASE GENOTYPE

## 2019-10-18 LAB — HIV-1 GENOTYPE: HIV-1 Genotype: DETECTED — AB

## 2019-10-19 ENCOUNTER — Telehealth: Payer: Self-pay | Admitting: Pharmacist

## 2019-10-19 NOTE — Telephone Encounter (Signed)
No problem! I will call her and schedule her to come in and see me. Will follow closely!

## 2019-10-19 NOTE — Telephone Encounter (Signed)
That cumulative genotype is challenging.  Can you have her come in for an appt with you to review with her and again in a month after the change?  She has very little options and I worry about her taking them consistently and want her on close follow up watch for sure.  My preference I think would be Symtuza + Tivicay BID.  She is going to need some coaching and probably a more fitting pill box!

## 2019-10-19 NOTE — Telephone Encounter (Signed)
Cumulative HIV Genotype Data  RT Mutations  M184V, K101E, E138K, T69N, V118I, R211K, F214L  PI Mutations  L10V, E35D, M36I, I62V, l63AS  Integrase Mutations  G140S, Q148H, M50I, D232E   Interpretation of Genotype Data per [employer] HIV Drug Resistance Database:  Nucleoside RTIs  Abacavir - low level resistance Zidovudine - susceptible Emtricitabine - high level resistance Lamivudine - high level resistance Tenofovir - susceptible   Non-Nucleoside RTIs  Doravirine - low level resistance Efavirenz - low level resistance Etravirine - low level resistance Nevirapine - intermediate resistance Rilpivirine - high level resistance   Protease Inhibitors  Atazanavir - susceptible Darunavir - susceptible Lopinavir - susceptible   Integrase Inhibitors  Bictegravir - intermediate resistance Cabotegravir - high level resistance Dolutegravir - intermediate resistance Elvitegravir - high level resistance Raltegravir - high level resistance

## 2019-10-19 NOTE — Telephone Encounter (Signed)
Called patient to schedule her an appointment with me. No answer, left HIPAA compliant VM. Will try again tomorrow.

## 2019-10-20 NOTE — Telephone Encounter (Signed)
Patient called back. Scheduled her an appointment with me on Monday 9/20.

## 2019-10-24 ENCOUNTER — Ambulatory Visit: Payer: Self-pay | Admitting: Pharmacist

## 2019-11-25 ENCOUNTER — Telehealth: Payer: Self-pay | Admitting: *Deleted

## 2019-11-25 NOTE — Telephone Encounter (Signed)
Patient's coworker noticed dark purple bruising between ankle and calf bilaterally at the the same level. Outsides of both legs. No pain. She said her coworker thinks these are blood clots. She wants to know if she can come in Monday (her day off) to see [place]. OK per Judeth Cornfield to schedule this appointment, as patient needs to be seen for medication change and has no-showed previous attempts. Andree Coss, RN

## 2019-11-28 ENCOUNTER — Ambulatory Visit: Payer: Self-pay | Admitting: Infectious Diseases

## 2019-11-28 ENCOUNTER — Telehealth: Payer: Self-pay

## 2019-11-28 NOTE — Telephone Encounter (Signed)
Attempted to call patient to confirm today's appointment. No answer, patient needs to see either [name] or Judeth Cornfield this week for medication management.   Sandie Ano, RN

## 2019-11-28 NOTE — Telephone Encounter (Signed)
Attempted to call the patient a second time to schedule a follow-up appointment, no answer. RN called [facility] to place patient's Prezcobix and Odefsey on hold, but patient has no more active refills. Pharmacist Zizi placed a note on the patient's account with instructions to have patient call our office at [phone] to schedule a follow-up appointment in order to get refills.   Sandie Ano, RN

## 2019-12-05 ENCOUNTER — Telehealth: Payer: Self-pay

## 2019-12-05 NOTE — Telephone Encounter (Signed)
Left patient a voice mail to call back to schedule an appointment with either [name] or Judeth Cornfield for medication management.

## 2019-12-13 IMAGING — CT CT HEAD W/O CM
4 series · 16 of 47 positions shown, 18 images · non-contrast
Comparison: None.

CLINICAL DATA: 52-year-old female with dizziness and hypertension.

EXAM:
CT HEAD WITHOUT CONTRAST
TECHNIQUE: Contiguous axial images were obtained from the base of the skull
through the vertex without intravenous contrast.

[Series 3: head without · axial · non-contrast · 0.42mm/px · z∈[-172,-52]mm · 7 of 33 slices shown, 9 images]
[im 5/33  brain]
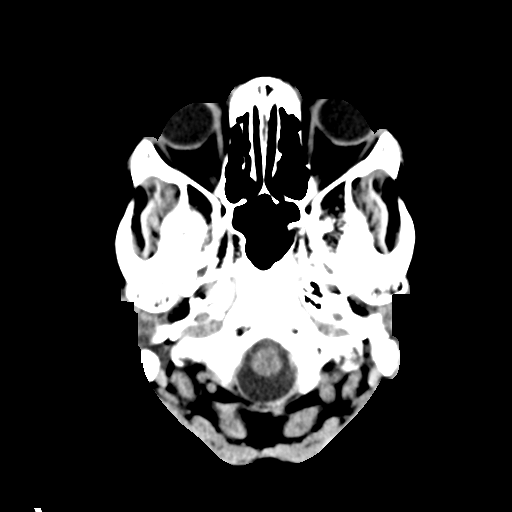
[im 5/33  bone]
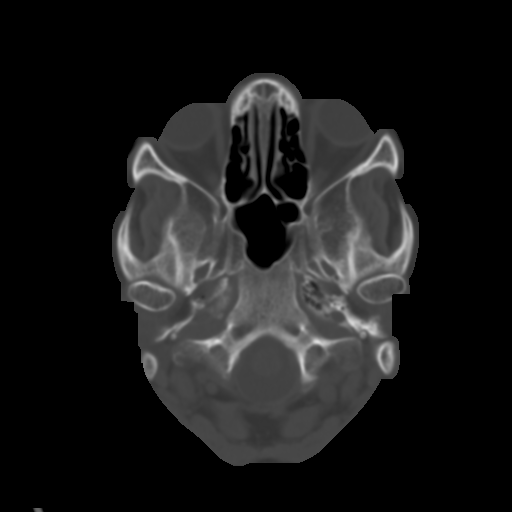
[im 9/33  brain]
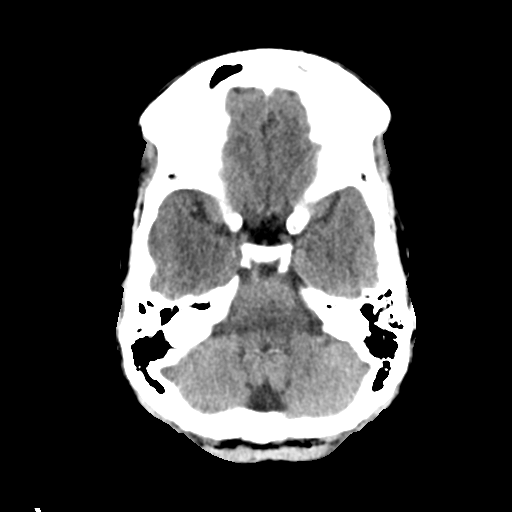
[im 13/33  brain]
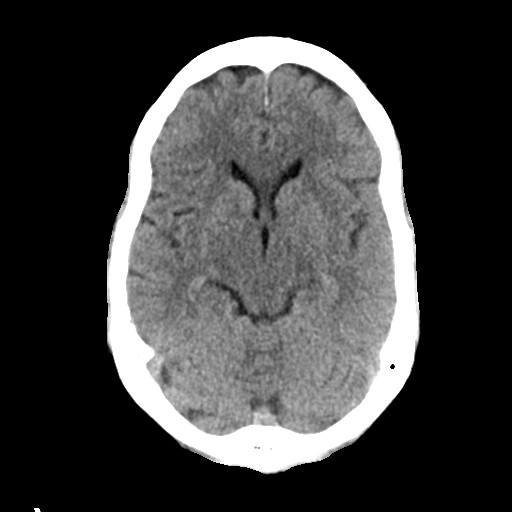
[im 17/33  brain]
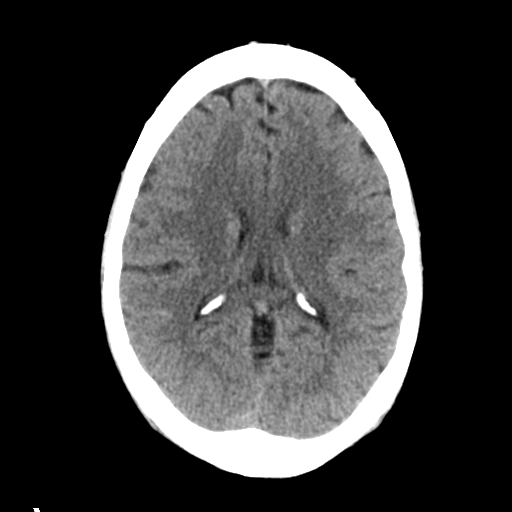
[im 21/33  brain]
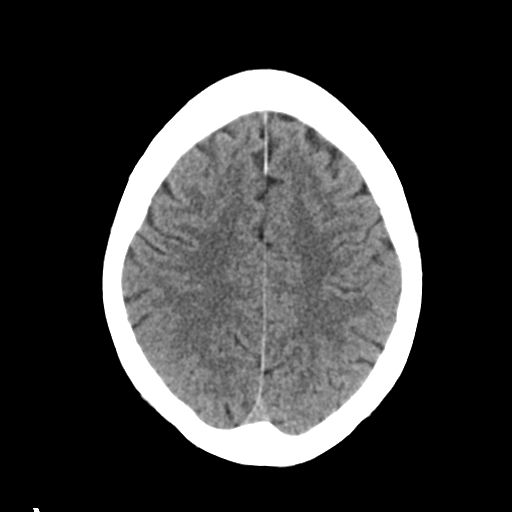
[im 21/33  bone]
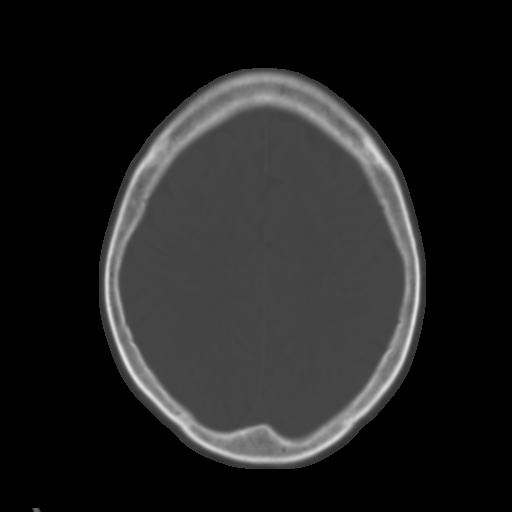
[im 25/33  brain]
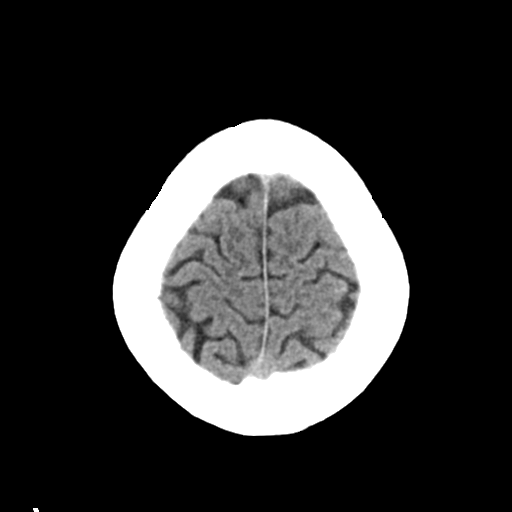
[im 29/33  brain]
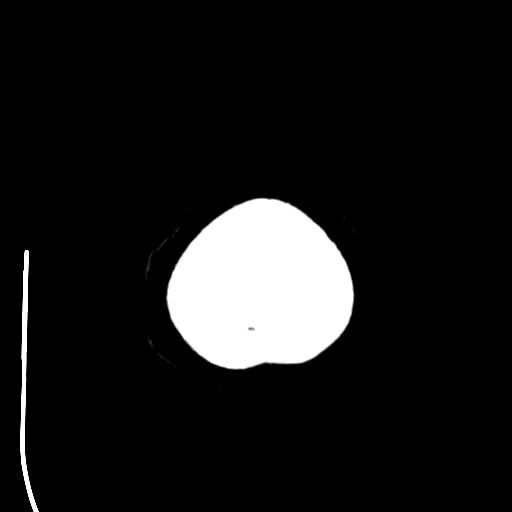

[Series 4: head bone · axial · 0.42mm/px · z∈[-176,-144]mm · 3 of 82 slices shown]
[im 9/82  bone]
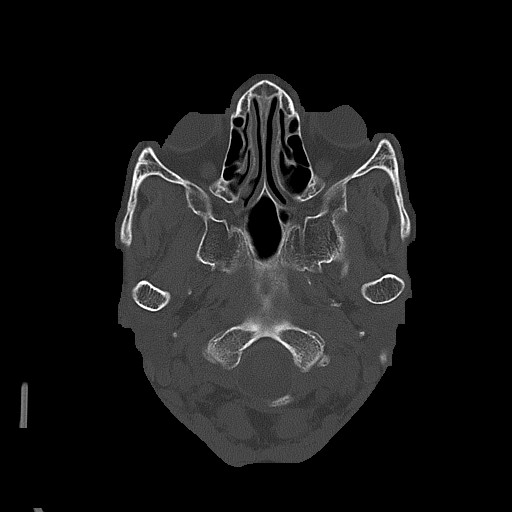
[im 17/82  bone]
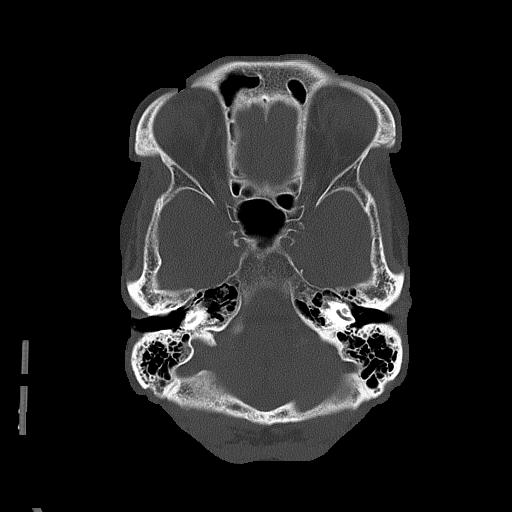
[im 25/82  bone]
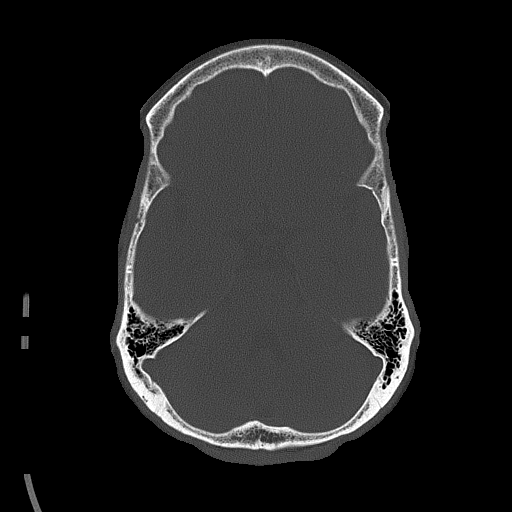

[Series 5: head without cor · coronal · non-contrast · 0.32mm/px · 3 of 67 slices shown]
[im 23/67  brain]
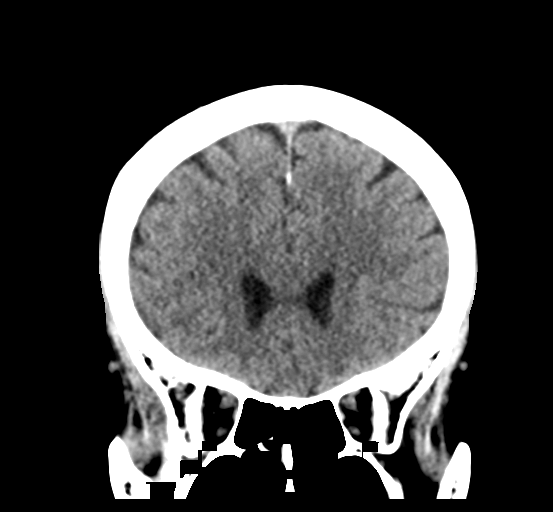
[im 30/67  brain]
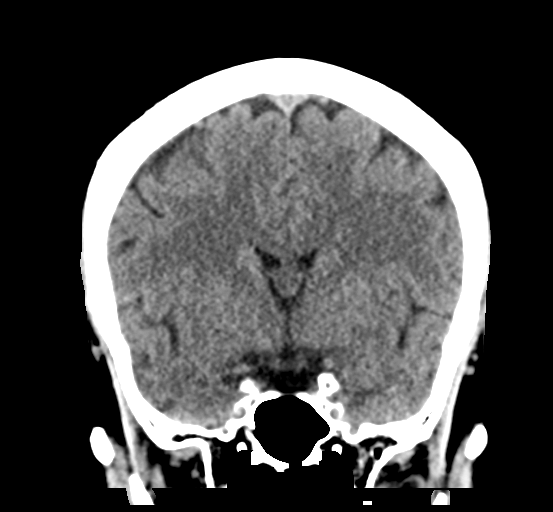
[im 37/67  brain]
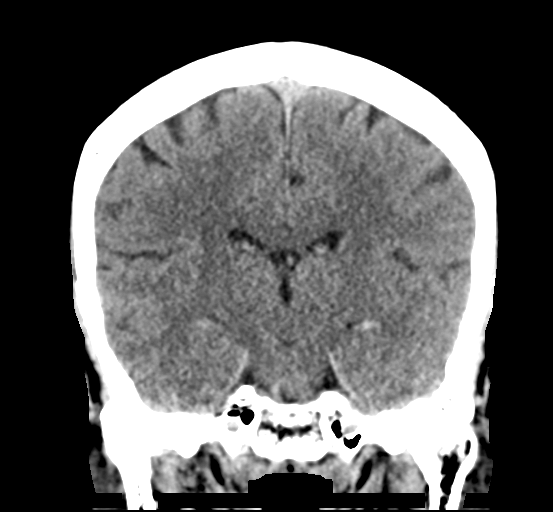

[Series 6: head without sag · sagittal · non-contrast · 0.32mm/px · 3 of 56 slices shown]
[im 19/56  brain]
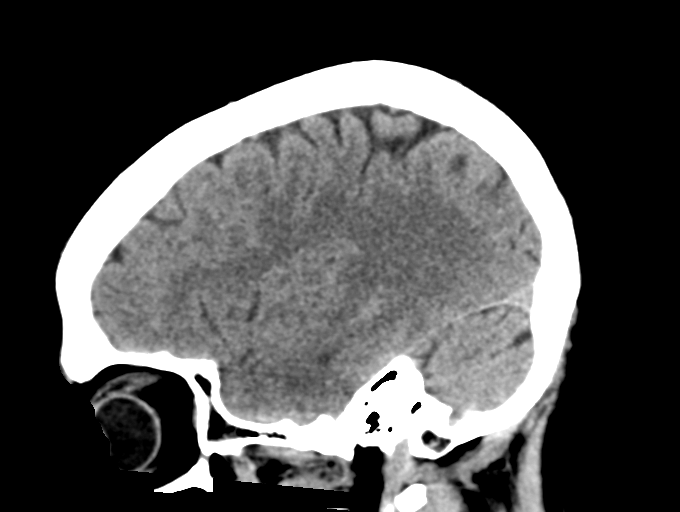
[im 28/56  brain]
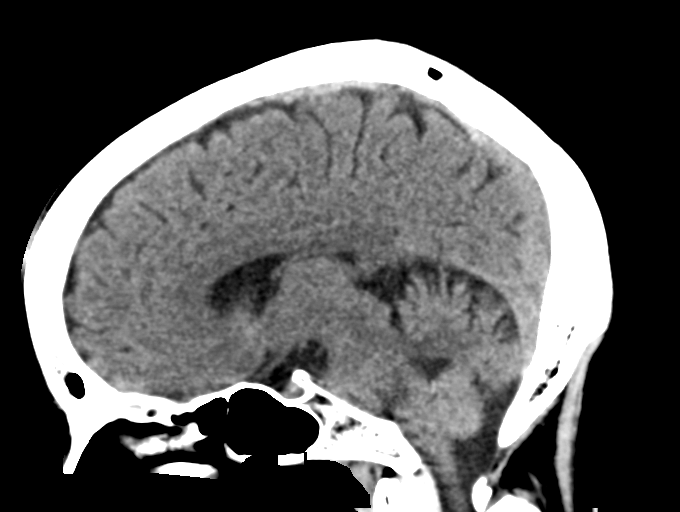
[im 37/56  brain]
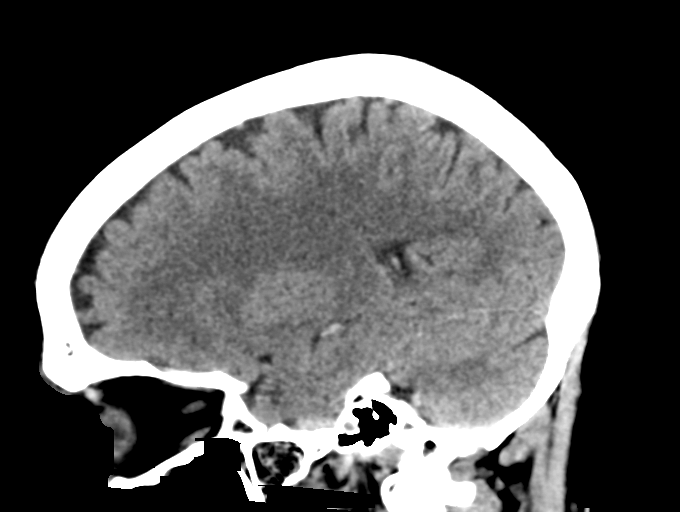

[16 of 47 positions shown; findings below may reference images not displayed]

FINDINGS: Brain: The ventricles and sulci appropriate size for patient's age.
The gray-white matter discrimination is preserved. There is no acute
intracranial hemorrhage. No mass effect or midline shift. No
extra-axial fluid collection.

Vascular: No hyperdense vessel or unexpected calcification.

Skull: Normal. Negative for fracture or focal lesion.

Sinuses/Orbits: No acute finding.

Other: None
IMPRESSION: Unremarkable noncontrast CT of the brain.

## 2019-12-19 ENCOUNTER — Ambulatory Visit: Payer: Self-pay | Admitting: Infectious Diseases

## 2019-12-19 ENCOUNTER — Other Ambulatory Visit: Payer: Self-pay | Admitting: Infectious Diseases

## 2019-12-19 DIAGNOSIS — B20 Human immunodeficiency virus [HIV] disease: Secondary | ICD-10-CM

## 2020-01-16 ENCOUNTER — Encounter: Payer: Self-pay | Admitting: Infectious Diseases

## 2020-01-16 ENCOUNTER — Other Ambulatory Visit: Payer: Self-pay

## 2020-01-16 ENCOUNTER — Ambulatory Visit (INDEPENDENT_AMBULATORY_CARE_PROVIDER_SITE_OTHER): Payer: Self-pay | Admitting: Infectious Diseases

## 2020-01-16 VITALS — BP 153/78 | HR 82 | Temp 97.6°F | Resp 16 | Ht 63.0 in | Wt 129.6 lb

## 2020-01-16 DIAGNOSIS — I10 Essential (primary) hypertension: Secondary | ICD-10-CM

## 2020-01-16 DIAGNOSIS — Z23 Encounter for immunization: Secondary | ICD-10-CM

## 2020-01-16 DIAGNOSIS — B20 Human immunodeficiency virus [HIV] disease: Secondary | ICD-10-CM

## 2020-01-16 DIAGNOSIS — F4321 Adjustment disorder with depressed mood: Secondary | ICD-10-CM

## 2020-01-16 MED ORDER — AMLODIPINE BESYLATE 10 MG PO TABS: 10.0000 mg | ORAL_TABLET | Freq: Every day | ORAL | 11 refills | Status: DC

## 2020-01-16 MED ORDER — SYMTUZA 800-150-200-10 MG PO TABS: 1.0000 | ORAL_TABLET | Freq: Every day | ORAL | 5 refills | Status: DC

## 2020-01-16 MED ORDER — ATENOLOL 50 MG PO TABS: 50.0000 mg | ORAL_TABLET | Freq: Every day | ORAL | 11 refills | Status: DC

## 2020-01-16 MED ORDER — TIVICAY 50 MG PO TABS: 50.0000 mg | ORAL_TABLET | Freq: Two times a day (BID) | ORAL | 5 refills | Status: DC

## 2020-01-16 NOTE — Patient Instructions (Addendum)
STOP the Odefsey (grey pill) and STOP the prezcobix (pink pill) Your blood pressure looks MUCH better today - I sent in refills of your Atenolol and Amlodipine for you to continue.    NEW PILLS: 1. SYMTUZA - large yellow pill. Take this once a day with food in the morning.  2. TIVICAY - small yellow circle pill. Take this TWICE a day. Once in the morning with Symtuza and another dose before bed.  3. BACTRIM - this is that antibiotic to protect your immune system temporarily     For your COVID booster - please call [facility] to set up an appointment Phone: [phone]  Please call [facility] @ [phone] to schedule a dental appointment for routine cleaning.   Come back in 4-6 weeks to see our pharmacist, [name] (Schedule on a Monday please) to see how your new medications are going. Will repeat your labs at this visit as well.   Hopeful you have a happy holiday with family!

## 2020-01-16 NOTE — Addendum Note (Signed)
Addended by: Blanchard Kelch on: 01/16/2020 03:27 PM   Modules accepted: Orders

## 2020-01-16 NOTE — Assessment & Plan Note (Signed)
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  Reviewed available options with our pharmacy team and decided to do Symtuza once daily with food and Tivicay twice daily.   This is probably the simplest regimen. Reviewed medication instructions, interactions, expected side effects.  We will have her follow-up in 4 to 6 weeks with [name] on a Monday so we can review tolerability and adherence and repeat viral load at that time.  We will check hepatitis B surface antibody at next opportunity.  I do not see that she is immune reviewing historical records. Flu and Prevnar vaccines given today. She will schedule her Pfizer booster at [employer] so she can receive 1 a day off.  I provided her the number today. She has seen the [facility] and dental team in the past, I provided her the number to schedule a follow-up appointment.

## 2020-01-16 NOTE — Progress Notes (Signed)
Name: Christine Dawson  DOB: May 03, 1965 MRN: [id number] PCP: Patient, No Pcp Per    Patient Active Problem List   Diagnosis Date Noted  . Screening for cervical cancer 12/13/2018  . AIDS (acquired immune deficiency syndrome) ([place]) 10/30/2018  . Grief reaction 10/30/2018  . Tobacco abuse 11/10/2011  . Hypertension 11/10/2011  . LGSIL of cervix of undetermined significance 01/01/2011  . Human immunodeficiency virus (HIV) disease ([place]) 09/28/2006  . History of syphilis 09/28/2006  . Alcohol abuse 09/28/2006  . History of hepatitis B 09/28/2006     Brief Narrative:  Christine Dawson is a 54 y.o. female with HIV, +AIDS, Dx 09-2006. CD4 nadir 58 (10-2018); VL 258,000 Jan 2019   HIV Risk: heterosexual  History of OIs: unknown Intake Labs 2014 Hep B sAg (- 2012), sAb (-), cAb (); Hep A (+2012), Hep C (- 2012) Quantiferon () HLA B*5701 (- 2013) G6PD: ()   Previous Regimens: Jorje Guild >> failed with resistance  . Odefsey + Prezcobix  . 01/2020 - Symtuza + BID Tivicay  .   Genotypes: 10/2019 -  Cumulative HIV Genotype Data  RT Mutations  M184V, K101E, E138K, T69N, V118I, R211K, F214L  PI Mutations  L10V, E35D, M36I, I62V, l63AS  Integrase Mutations  G140S, Q148H, M50I, D232E   Interpretation of Genotype Data per [employer] HIV Drug Resistance Database:  Nucleoside RTIs  Abacavir - low level resistance Zidovudine - susceptible Emtricitabine - high level resistance Lamivudine - high level resistance Tenofovir - susceptible   Non-Nucleoside RTIs  Doravirine - low level resistance Efavirenz - low level resistance Etravirine - low level resistance Nevirapine - intermediate resistance Rilpivirine - high level resistance   Protease Inhibitors  Atazanavir - susceptible Darunavir - susceptible Lopinavir - susceptible   Integrase Inhibitors  Bictegravir - intermediate resistance Cabotegravir - high level resistance Dolutegravir - intermediate resistance Elvitegravir -  high level resistance Raltegravir - high level resistance     Subjective:   Chief Complaint  Patient presents with  . Follow-up    b20     HPI: Christine Dawson is been doing better since her last office visit.  She tells me her stress is under much better control following the sudden loss of her son and she has good support at home with her sisters.  Work has been better she has every Sunday and Monday off.  Sleep is okay eating well without any weight loss.  Requesting her flu shot today.  Has not yet had Covid booster shot but is willing to receive it.  Stopped HIV pills and ready to start back on new ones that we may recommend.  She has continued to take her Bactrim once a day. She is very happy that her blood pressure looks good today.  Needs a refill on her 2 medicines.  Requesting dental visit - has a missing tooth and wants to get a cleaning and eval to get.    Review of Systems  Constitutional: Negative for chills, fever, malaise/fatigue and weight loss.  HENT:       Missing a tooth  Respiratory: Negative for cough and sputum production.   Cardiovascular: Negative for chest pain and leg swelling.  Gastrointestinal: Negative for abdominal pain, diarrhea and vomiting.  Genitourinary: Negative for dysuria and flank pain.  Musculoskeletal: Negative for joint pain, myalgias and neck pain.  Skin: Negative for rash.  Neurological: Negative for dizziness, tingling and headaches.  Psychiatric/Behavioral: Negative for depression and substance abuse. The patient is not nervous/anxious and does  not have insomnia.     Past Medical History:  Diagnosis Date  . Abnormal Pap smear 08/2010  . HIV infection ([place])     Outpatient Medications Prior to Visit  Medication Sig Dispense Refill  . sulfamethoxazole-trimethoprim (BACTRIM DS) 800-160 MG tablet Take 1 tablet by mouth 3 (three) times a week. 30 tablet 2  . amLODipine (NORVASC) 10 MG tablet TAKE 1 TABLET(10 MG) BY MOUTH DAILY 30 tablet 4  .  atenolol (TENORMIN) 50 MG tablet TAKE 1 TABLET(50 MG) BY MOUTH DAILY 30 tablet 4  . darunavir-cobicistat (PREZCOBIX) 800-150 MG tablet Take 1 tablet by mouth daily with breakfast. Swallow whole. Do NOT crush, break or chew tablets. Take with food. 30 tablet 5  . emtricitabine-rilpivir-tenofovir AF (ODEFSEY) 200-25-25 MG TABS tablet Take 1 tablet by mouth daily with breakfast. 30 tablet 5   No facility-administered medications prior to visit.     No Known Allergies  Social History   Tobacco Use  . Smoking status: Current Every Day Smoker    Packs/day: 0.50    Types: Cigarettes  . Smokeless tobacco: Never Used  . Tobacco comment: cutting back  Substance Use Topics  . Alcohol use: Yes    Alcohol/week: 21.0 standard drinks    Types: 21 Standard drinks or equivalent per week    Comment: beer  . Drug use: Not Currently    Social History   Substance and Sexual Activity  Sexual Activity Not Currently   Comment: refused condom 12/2018     Objective:   Vitals:   01/16/20 1020  BP: (!) 153/78  Pulse: 82  Resp: 16  Temp: 97.6 F (36.4 C)  SpO2: 98%  Weight: 129 lb 9.6 oz (58.8 kg)  Height: '5\' 3"'  (1.6 m)   Body mass index is 22.96 kg/m.   Physical Exam Constitutional:      Appearance: She is well-developed and well-nourished.     Comments: Seated comfortably in chair.   HENT:     Mouth/Throat:     Mouth: Mucous membranes are normal. No oral lesions.     Dentition: Normal dentition. No dental abscesses.     Pharynx: No oropharyngeal exudate.  Cardiovascular:     Rate and Rhythm: Normal rate and regular rhythm.     Heart sounds: Normal heart sounds.  Pulmonary:     Effort: Pulmonary effort is normal.     Breath sounds: Normal breath sounds.  Abdominal:     General: There is no distension.     Palpations: Abdomen is soft.     Tenderness: There is no abdominal tenderness.  Lymphadenopathy:     Cervical: No cervical adenopathy.  Skin:    General: Skin is warm and  dry.     Findings: No rash.  Neurological:     Mental Status: She is alert and oriented to person, place, and time.  Psychiatric:        Mood and Affect: Mood and affect normal.        Judgment: Judgment normal.     Comments: In good spirits today and engaged in care discussion     Lab Results Lab Results  Component Value Date   WBC 4.6 11/18/2018   HGB 11.8 (L) 11/18/2018   HCT 37.3 11/18/2018   MCV 92.6 11/18/2018   PLT 208 11/18/2018    Lab Results  Component Value Date   CREATININE 0.99 08/10/2019   BUN 15 08/10/2019   NA 138 08/10/2019   K 4.7 08/10/2019  CL 107 08/10/2019   CO2 24 08/10/2019    Lab Results  Component Value Date   ALT 9 08/10/2019   AST 18 08/10/2019   ALKPHOS 58 06/16/2016   BILITOT 0.3 08/10/2019    Lab Results  Component Value Date   CHOL 134 02/04/2017   HDL 68 02/04/2017   LDLCALC 45 02/04/2017   TRIG 127 02/04/2017   CHOLHDL 2.0 02/04/2017   HIV 1 RNA Quant (copies/mL)  Date Value  10/03/2019 6,590 (H)  08/10/2019 2,260 (H)  04/18/2019 104 (H)   CD4 T Cell Abs (/uL)  Date Value  08/10/2019 180 (L)  04/18/2019 204 (L)  12/13/2018 166 (L)     Assessment & Plan:   Problem List Items Addressed This Visit      Unprioritized   Hypertension (Chronic)    BP Readings from Last 3 Encounters:  01/16/20 (!) 153/78  10/03/19 (!) 153/81  04/18/19 (!) 145/80   Her blood pressures have actually looked pretty good on the current regimen of atenolol 50 mg and amlodipine 10 mg.  May need to consider increasing the amlodipine at the next visit to try to get her into goal closer to 130/80.  I want make to many medication changes today however; will send in refills of current doses.      Relevant Medications   amLODipine (NORVASC) 10 MG tablet   atenolol (TENORMIN) 50 MG tablet   Human immunodeficiency virus (HIV) disease (HCC) (Chronic)    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  Reviewed available options with our  pharmacy team and decided to do Symtuza once daily with food and Tivicay twice daily.   This is probably the simplest regimen. Reviewed medication instructions, interactions, expected side effects.  We will have her follow-up in 4 to 6 weeks with [name] on a Monday so we can review tolerability and adherence and repeat viral load at that time.  We will check hepatitis B surface antibody at next opportunity.  I do not see that she is immune reviewing historical records. Flu and Prevnar vaccines given today. She will schedule her Pfizer booster at [employer] so she can receive 1 a day off.  I provided her the number today. She has seen the [facility] and dental team in the past, I provided her the number to schedule a follow-up appointment.       Grief reaction    Improved in a supportive environment.      AIDS (acquired immune deficiency syndrome) ([place])    May is continued her Bactrim once a day for prophylaxis while waiting further instruction on HIV regimen changes.  No signs or concerns for opportunistic infection on exam today.  We will continue this until CD4 is consistently above 200 x 3.       Other Visit Diagnoses    Essential hypertension       Relevant Medications   amLODipine (NORVASC) 10 MG tablet   atenolol (TENORMIN) 50 MG tablet      Janene Madeira, MSN, NP-C [facility] [place] Pager: [phone] Office: [phone]  01/16/20  10:59 AM

## 2020-01-16 NOTE — Assessment & Plan Note (Signed)
Improved in a supportive environment.

## 2020-01-16 NOTE — Assessment & Plan Note (Addendum)
BP Readings from Last 3 Encounters:  01/16/20 (!) 153/78  10/03/19 (!) 153/81  04/18/19 (!) 145/80   Her blood pressures have actually looked pretty good on the current regimen of atenolol 50 mg and amlodipine 10 mg.  May need to consider increasing the amlodipine at the next visit to try to get her into goal closer to 130/80.  I want make to many medication changes today however; will send in refills of current doses.

## 2020-01-16 NOTE — Addendum Note (Signed)
Addended by: Clayborne Artist A on: 01/16/2020 11:15 AM   Modules accepted: Orders

## 2020-01-16 NOTE — Assessment & Plan Note (Signed)
Christine Dawson is continued her Bactrim once a day for prophylaxis while waiting further instruction on HIV regimen changes.  No signs or concerns for opportunistic infection on exam today.  We will continue this until CD4 is consistently above 200 x 3.

## 2020-02-20 ENCOUNTER — Ambulatory Visit: Payer: Self-pay | Admitting: Infectious Diseases

## 2020-03-12 ENCOUNTER — Other Ambulatory Visit: Payer: Self-pay

## 2020-03-12 ENCOUNTER — Ambulatory Visit (INDEPENDENT_AMBULATORY_CARE_PROVIDER_SITE_OTHER): Payer: Self-pay | Admitting: Infectious Diseases

## 2020-03-12 VITALS — BP 130/71 | HR 64 | Temp 98.1°F | Wt 132.0 lb

## 2020-03-12 DIAGNOSIS — B20 Human immunodeficiency virus [HIV] disease: Secondary | ICD-10-CM

## 2020-03-12 DIAGNOSIS — Z23 Encounter for immunization: Secondary | ICD-10-CM

## 2020-03-12 DIAGNOSIS — F4321 Adjustment disorder with depressed mood: Secondary | ICD-10-CM

## 2020-03-12 DIAGNOSIS — I10 Essential (primary) hypertension: Secondary | ICD-10-CM

## 2020-03-12 DIAGNOSIS — Z0000 Encounter for general adult medical examination without abnormal findings: Secondary | ICD-10-CM

## 2020-03-12 DIAGNOSIS — R87612 Low grade squamous intraepithelial lesion on cytologic smear of cervix (LGSIL): Secondary | ICD-10-CM

## 2020-03-12 NOTE — Progress Notes (Signed)
   Covid-19 Vaccination Clinic  Name:  Christine Dawson    MRN: [id number] DOB: 13-Jun-1965  03/12/2020  Ms. Christine Dawson was observed post Covid-19 immunization for 15 minutes without incident. She was provided with Vaccine Information Sheet and instruction to access the V-Safe system.   Ms. Christine Dawson was instructed to call 911 with any severe reactions post vaccine: Marland Kitchen Difficulty breathing  . Swelling of face and throat  . A fast heartbeat  . A bad rash all over body  . Dizziness and weakness  Doni Bacha T Pricilla Loveless

## 2020-03-12 NOTE — Progress Notes (Signed)
Name: Christine Dawson  DOB: Jul 03, 1965 MRN: [id number] PCP: Patient, No Pcp Per     Brief Narrative:  KAYLANA FENSTERMACHER is a 55 y.o. female with HIV, +AIDS, Dx 09-2006. CD4 nadir 58 (10-2018); VL 258,000 Jan 2019   HIV Risk: heterosexual  History of OIs: unknown Intake Labs 2014 Hep B sAg (- 2012), sAb (-), cAb (); Hep A (+2012), Hep C (- 2012) Quantiferon () HLA B*5701 (- 2013) G6PD: ()   Previous Regimens: Jorje Guild >> failed with resistance  . Odefsey + Prezcobix  . 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 See Cumulative data report in Overview of Problem.    Subjective:   Chief Complaint  Patient presents with  . Follow-up    B20     HPI: Christine Dawson is doing well today. She has tried so hard to keep her stress under better control and taking her prescribed medications much more consistently. She has had no episodes of palpitations or headaches.   Doing well with Symtuza + BID Tivicay. No missed doses. Thankful that the smaller pill is the twice a day one. She is taking it correctly with food.   She is followed by [facility] dental team. Working with [place] on housing. She is currently staying with her daughter but ready for her own place. Would like COVID booster today if we can accommodate for her.   No family hx colon or breast cancer. H/o abnormal pap smears in the past with +HR HPV in the past.     Review of Systems  Constitutional: Negative for chills, fever, malaise/fatigue and weight loss.  HENT: Negative for sore throat.        No dental problems  Respiratory: Negative for cough and sputum production.   Cardiovascular: Negative for chest pain and leg swelling.  Gastrointestinal: Negative for abdominal pain, diarrhea and vomiting.  Genitourinary: Negative for dysuria and flank pain.  Musculoskeletal: Negative for joint pain, myalgias and neck pain.  Skin: Negative for rash.   Neurological: Negative for dizziness, tingling and headaches.  Psychiatric/Behavioral: Negative for depression and substance abuse. The patient is not nervous/anxious and does not have insomnia.     Past Medical History:  Diagnosis Date  . Abnormal Pap smear 08/2010  . HIV infection [facility])     Outpatient Medications Prior to Visit  Medication Sig Dispense Refill  . amLODipine (NORVASC) 10 MG tablet Take 1 tablet (10 mg total) by mouth daily. 30 tablet 11  . atenolol (TENORMIN) 50 MG tablet Take 1 tablet (50 mg total) by mouth daily. 30 tablet 11  . Darunavir-Cobicisctat-Emtricitabine-Tenofovir Alafenamide (SYMTUZA) 800-150-200-10 MG TABS Take 1 tablet by mouth daily with breakfast. 30 tablet 5  . dolutegravir (TIVICAY) 50 MG tablet Take 1 tablet (50 mg total) by mouth 2 (two) times daily. 60 tablet 5  . sulfamethoxazole-trimethoprim (BACTRIM DS) 800-160 MG tablet Take 1 tablet by mouth 3 (three) times a week. 30 tablet 2   No facility-administered medications prior to visit.     No Known Allergies  Social History   Tobacco Use  . Smoking status: Current Every Day Smoker    Packs/day: 0.50    Types: Cigarettes  . Smokeless tobacco: Never Used  . Tobacco comment: cutting back  Substance Use Topics  . Alcohol use: Yes    Alcohol/week: 21.0 standard drinks    Types: 21 Standard drinks or equivalent per week  Comment: beer  . Drug use: Not Currently    Social History   Substance and Sexual Activity  Sexual Activity Not Currently   Comment: refused condom 12/2018     Objective:   Vitals:   03/12/20 1349  BP: 130/71  Pulse: 64  Temp: 98.1 F (36.7 C)  TempSrc: Oral  Weight: 132 lb (59.9 kg)   Body mass index is 23.38 kg/m.   Physical Exam HENT:     Mouth/Throat:     Mouth: No oral lesions.     Dentition: No dental abscesses.  Cardiovascular:     Rate and Rhythm: Normal rate and regular rhythm.     Heart sounds: Normal heart sounds.  Pulmonary:      Effort: Pulmonary effort is normal.     Breath sounds: Normal breath sounds.  Abdominal:     General: There is no distension.     Palpations: Abdomen is soft.     Tenderness: There is no abdominal tenderness.  Musculoskeletal:        General: No tenderness. Normal range of motion.  Lymphadenopathy:     Cervical: No cervical adenopathy.  Skin:    General: Skin is warm and dry.     Findings: No rash.  Neurological:     Mental Status: She is alert and oriented to person, place, and time.  Psychiatric:        Judgment: Judgment normal.     Lab Results Lab Results  Component Value Date   WBC 3.8 03/12/2020   HGB 12.3 03/12/2020   HCT 37.4 03/12/2020   MCV 90.3 03/12/2020   PLT 181 03/12/2020    Lab Results  Component Value Date   CREATININE 0.82 03/12/2020   BUN 11 03/12/2020   NA 138 03/12/2020   K 4.8 03/12/2020   CL 105 03/12/2020   CO2 28 03/12/2020    Lab Results  Component Value Date   ALT 7 03/12/2020   AST 16 03/12/2020   ALKPHOS 58 06/16/2016   BILITOT 0.4 03/12/2020    Lab Results  Component Value Date   CHOL 134 02/04/2017   HDL 68 02/04/2017   LDLCALC 45 02/04/2017   TRIG 127 02/04/2017   CHOLHDL 2.0 02/04/2017   HIV 1 RNA Quant (copies/mL)  Date Value  10/03/2019 6,590 (H)  08/10/2019 2,260 (H)  04/18/2019 104 (H)   CD4 T Cell Abs (/uL)  Date Value  08/10/2019 180 (L)  04/18/2019 204 (L)  12/13/2018 166 (L)     Assessment & Plan:   Patient Active Problem List   Diagnosis Date Noted  . Healthcare maintenance 12/13/2018  . AIDS (acquired immune deficiency syndrome) ([place]) 10/30/2018  . Grief reaction 10/30/2018  . Tobacco abuse 11/10/2011  . Hypertension 11/10/2011  . LGSIL of cervix of undetermined significance 01/01/2011  . Human immunodeficiency virus (HIV) disease ([place]) 09/28/2006  . History of syphilis 09/28/2006  . Alcohol abuse 09/28/2006  . History of hepatitis B 09/28/2006    Problem List Items Addressed This Visit       Unprioritized   LGSIL of cervix of undetermined significance    Will plan to repeat pap smear at next appt with history of + HR HPV last year. Will need referral for colposcopy if persistent finding.       Hypertension (Chronic)    Under good control on current regimen amlodipine + atenolol - no adjustments needed. Will check CMP and CBC today.       Human immunodeficiency virus (  HIV) disease ([place]) - Primary (Chronic)    Doing well on Symtuza and BID Tivicay and taking this correctly without missed doses. I will update VL today for therapeutic monitoring of new medication regimen given MDR HIV virus.  Vaccines updated including COVID.  Return in about 3 months (around 06/09/2020).       Relevant Orders   HIV-1 RNA quant-no reflex-bld   T-helper cell (CD4)- ([facility] clinic only)   COMPLETE METABOLIC PANEL WITH GFR (Completed)   CBC with Differential/Platelet (Completed)   Healthcare maintenance    She is not currently working with primary care.  Never had colonoscopy - no family history. Unclear if we can arrange colo-guard testing here but she would be a good candidate for that given low risk of colon cancer.  Due for mammogram - will discuss at upcoming appointment and help with scholarship application.  COVID booster today  Pap planned next appt.       Grief reaction    In supportive environment with family and case management team. Working on stress reduction.       AIDS (acquired immune deficiency syndrome) ([place])    She also continues on Bactrim once daily for prophylaxis. If CD4 > 200 next check will have her stop given she was pretty close to 200 last check. No signs of OI on exam today.        Other Visit Diagnoses    Encounter for immunization       Relevant Orders   PFIZER Comirnaty(GRAY TOP)COVID-19 Vaccine (Completed)      Janene Madeira, MSN, NP-C [place] [facility] Pager: [phone] Office:  [phone]  03/13/20  9:18 AM

## 2020-03-12 NOTE — Patient Instructions (Addendum)
So nice to see you today!  Very happy to say your blood pressure looks amazing. Perfect!  Please stop by on your way out for lab work - I will call you with results and any changes to your medication.   For now please continue everything as you have been.   Next visit please schedule 30 minute appointment so we can do your pap smear as well.

## 2020-03-13 LAB — T-HELPER CELL (CD4) - (RCID CLINIC ONLY)
CD4 % Helper T Cell: 12 % — ABNORMAL LOW (ref 33–65)
CD4 T Cell Abs: 169 /uL — ABNORMAL LOW (ref 400–1790)

## 2020-03-13 NOTE — Assessment & Plan Note (Signed)
Under good control on current regimen amlodipine + atenolol - no adjustments needed. Will check CMP and CBC today.

## 2020-03-13 NOTE — Assessment & Plan Note (Signed)
Will plan to repeat pap smear at next appt with history of + HR HPV last year. Will need referral for colposcopy if persistent finding.

## 2020-03-13 NOTE — Assessment & Plan Note (Signed)
She is not currently working with primary care.  Never had colonoscopy - no family history. Unclear if we can arrange colo-guard testing here but she would be a good candidate for that given low risk of colon cancer.  Due for mammogram - will discuss at upcoming appointment and help with scholarship application.  COVID booster today  Pap planned next appt.

## 2020-03-13 NOTE — Assessment & Plan Note (Addendum)
She also continues on Bactrim once daily for prophylaxis. If CD4 > 200 next check will have her stop given she was pretty close to 200 last check. No signs of OI on exam today.

## 2020-03-13 NOTE — Assessment & Plan Note (Addendum)
Doing well on Symtuza and BID Tivicay and taking this correctly without missed doses. I will update VL today for therapeutic monitoring of new medication regimen given MDR HIV virus.  Vaccines updated including COVID.  Return in about 3 months (around 06/09/2020).

## 2020-03-13 NOTE — Assessment & Plan Note (Signed)
In supportive environment with family and case management team. Working on stress reduction.

## 2020-03-14 ENCOUNTER — Telehealth: Payer: Self-pay | Admitting: *Deleted

## 2020-03-14 LAB — CBC WITH DIFFERENTIAL/PLATELET
Absolute Monocytes: 619 cells/uL (ref 200–950)
Basophils Absolute: 49 cells/uL (ref 0–200)
Basophils Relative: 1.3 %
Eosinophils Absolute: 122 cells/uL (ref 15–500)
Eosinophils Relative: 3.2 %
HCT: 37.4 % (ref 35.0–45.0)
Hemoglobin: 12.3 g/dL (ref 11.7–15.5)
Lymphs Abs: 1600 cells/uL (ref 850–3900)
MCH: 29.7 pg (ref 27.0–33.0)
MCHC: 32.9 g/dL (ref 32.0–36.0)
MCV: 90.3 fL (ref 80.0–100.0)
MPV: 9.9 fL (ref 7.5–12.5)
Monocytes Relative: 16.3 %
Neutro Abs: 1410 cells/uL — ABNORMAL LOW (ref 1500–7800)
Neutrophils Relative %: 37.1 %
Platelets: 181 10*3/uL (ref 140–400)
RBC: 4.14 10*6/uL (ref 3.80–5.10)
RDW: 12.6 % (ref 11.0–15.0)
Total Lymphocyte: 42.1 %
WBC: 3.8 10*3/uL (ref 3.8–10.8)

## 2020-03-14 LAB — COMPLETE METABOLIC PANEL WITH GFR
AG Ratio: 1.2 (calc) (ref 1.0–2.5)
ALT: 7 U/L (ref 6–29)
AST: 16 U/L (ref 10–35)
Albumin: 4 g/dL (ref 3.6–5.1)
Alkaline phosphatase (APISO): 81 U/L (ref 37–153)
BUN: 11 mg/dL (ref 7–25)
CO2: 28 mmol/L (ref 20–32)
Calcium: 9.1 mg/dL (ref 8.6–10.4)
Chloride: 105 mmol/L (ref 98–110)
Creat: 0.82 mg/dL (ref 0.50–1.05)
GFR, Est African American: 93 mL/min/{1.73_m2} (ref >=60)
GFR, Est Non African American: 80 mL/min/{1.73_m2} (ref >=60)
Globulin: 3.4 g/dL (calc) (ref 1.9–3.7)
Glucose, Bld: 96 mg/dL (ref 65–99)
Potassium: 4.8 mmol/L (ref 3.5–5.3)
Sodium: 138 mmol/L (ref 135–146)
Total Bilirubin: 0.4 mg/dL (ref 0.2–1.2)
Total Protein: 7.4 g/dL (ref 6.1–8.1)

## 2020-03-14 LAB — HIV-1 RNA QUANT-NO REFLEX-BLD
HIV 1 RNA Quant: 22900 Copies/mL — ABNORMAL HIGH
HIV-1 RNA Quant, Log: 4.36 Log cps/mL — ABNORMAL HIGH

## 2020-03-14 NOTE — Telephone Encounter (Signed)
Left voicemail on Christine Dawson's voicemail askingher to call back, that Christine Dawson wanted to talk to her about a new program. Will follow. Andree Coss, RN

## 2020-03-14 NOTE — Telephone Encounter (Signed)
-----   Message from Blanchard Kelch, NP sent at 03/14/2020  1:54 PM EST ----- Marcelino Duster can I enlist your help to call Kristell to let her know that her viral load is higher than it was 5 months ago.  She is supposed to be on Symtuza + BID Tivicay.  I worry about her greatly that she is not taking her medication reliably. Would love to get her plugged into our paramedicine pilot program.

## 2020-04-23 ENCOUNTER — Other Ambulatory Visit: Payer: Self-pay | Admitting: Infectious Diseases

## 2020-04-23 DIAGNOSIS — B20 Human immunodeficiency virus [HIV] disease: Secondary | ICD-10-CM

## 2020-04-26 ENCOUNTER — Telehealth: Payer: Self-pay

## 2020-05-04 ENCOUNTER — Encounter: Payer: Self-pay | Admitting: Infectious Diseases

## 2020-06-11 ENCOUNTER — Ambulatory Visit: Payer: Self-pay | Admitting: Infectious Diseases

## 2020-06-18 ENCOUNTER — Ambulatory Visit: Payer: Self-pay | Admitting: Infectious Disease

## 2020-07-20 ENCOUNTER — Other Ambulatory Visit: Payer: Self-pay | Admitting: Infectious Diseases

## 2020-07-20 DIAGNOSIS — B20 Human immunodeficiency virus [HIV] disease: Secondary | ICD-10-CM

## 2020-07-23 ENCOUNTER — Telehealth: Payer: Self-pay

## 2020-07-23 ENCOUNTER — Other Ambulatory Visit: Payer: Self-pay

## 2020-07-23 DIAGNOSIS — B20 Human immunodeficiency virus [HIV] disease: Secondary | ICD-10-CM

## 2020-07-23 MED ORDER — SULFAMETHOXAZOLE-TRIMETHOPRIM 800-160 MG PO TABS: ORAL_TABLET | ORAL | 5 refills | Status: DC

## 2020-07-23 NOTE — Telephone Encounter (Signed)
Refills sent

## 2020-07-23 NOTE — Telephone Encounter (Signed)
Would you like the patient to continue taking the bactrim?

## 2020-07-27 NOTE — Progress Notes (Deleted)
Name: Christine Dawson  DOB: 1965/03/27 MRN: [id number] PCP: Patient, No Pcp Per (Inactive)     Brief Narrative:  Christine Dawson is a 55 y.o. female with HIV, +AIDS, Dx 09-2006. 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 See Cumulative data report in Overview of Problem.    Subjective:   No chief complaint on file.    HPI:  Missed a few appointments with ID clinic.   Christine Dawson is doing well today. She has tried so hard to keep her stress under better control and taking her prescribed medications much more consistently. She has had no episodes of palpitations or headaches.   Doing well with Symtuza + BID Tivicay. No missed doses. Thankful that the smaller pill is the twice a day one. She is taking it correctly with food.   She is followed by [facility] dental team. Working with [place] on housing. She is currently staying with her daughter but ready for her own place. Would like COVID booster today if we can accommodate for her.   No family hx colon or breast cancer. H/o abnormal pap smears in the past with +HR HPV in the past.     Review of Systems  Constitutional:  Negative for chills, fever, malaise/fatigue and weight loss.  HENT:  Negative for sore throat.        No dental problems  Respiratory:  Negative for cough and sputum production.   Cardiovascular:  Negative for chest pain and leg swelling.  Gastrointestinal:  Negative for abdominal pain, diarrhea and vomiting.  Genitourinary:  Negative for dysuria and flank pain.  Musculoskeletal:  Negative for joint pain, myalgias and neck pain.   Skin:  Negative for rash.  Neurological:  Negative for dizziness, tingling and headaches.  Psychiatric/Behavioral:  Negative for depression and substance abuse. The patient is not nervous/anxious and does not have insomnia.    Past Medical History:  Diagnosis Date   Abnormal Pap smear 08/2010   HIV infection [facility])     Outpatient Medications Prior to Visit  Medication Sig Dispense Refill   amLODipine (NORVASC) 10 MG tablet Take 1 tablet (10 mg total) by mouth daily. 30 tablet 11   atenolol (TENORMIN) 50 MG tablet Take 1 tablet (50 mg total) by mouth daily. 30 tablet 11   sulfamethoxazole-trimethoprim (BACTRIM DS) 800-160 MG tablet TAKE 1 TABLET BY MOUTH 3 TIMES A WEEK 30 tablet 5   SYMTUZA 800-150-200-10 MG TABS TAKE 1 TABLET BY MOUTH DAILY WITH BREAKFAST 30 tablet 0   TIVICAY 50 MG tablet TAKE 1 TABLET(50 MG) BY MOUTH TWICE DAILY 60 tablet 0   No facility-administered medications prior to visit.     No Known Allergies  Social History   Tobacco Use   Smoking status: Every Day    Packs/day: 0.50    Pack years: 0.00    Types: Cigarettes   Smokeless tobacco: Never   Tobacco comments:    cutting back  Substance Use Topics   Alcohol use: Yes    Alcohol/week: 21.0 standard drinks    Types: 21 Standard  drinks or equivalent per week    Comment: beer   Drug use: Not Currently    Social History   Substance and Sexual Activity  Sexual Activity Not Currently   Comment: refused condom 12/2018     Objective:   There were no vitals filed for this visit.  There is no height or weight on file to calculate BMI.   Physical Exam HENT:     Mouth/Throat:     Mouth: No oral lesions.     Dentition: No dental abscesses.  Cardiovascular:     Rate and Rhythm: Normal rate and regular rhythm.     Heart sounds: Normal heart sounds.  Pulmonary:     Effort: Pulmonary effort is normal.     Breath sounds: Normal breath sounds.  Abdominal:     General: There is no distension.      Palpations: Abdomen is soft.     Tenderness: There is no abdominal tenderness.  Musculoskeletal:        General: No tenderness. Normal range of motion.  Lymphadenopathy:     Cervical: No cervical adenopathy.  Skin:    General: Skin is warm and dry.     Findings: No rash.  Neurological:     Mental Status: She is alert and oriented to person, place, and time.  Psychiatric:        Judgment: Judgment normal.    Lab Results Lab Results  Component Value Date   WBC 3.8 03/12/2020   HGB 12.3 03/12/2020   HCT 37.4 03/12/2020   MCV 90.3 03/12/2020   PLT 181 03/12/2020    Lab Results  Component Value Date   CREATININE 0.82 03/12/2020   BUN 11 03/12/2020   NA 138 03/12/2020   K 4.8 03/12/2020   CL 105 03/12/2020   CO2 28 03/12/2020    Lab Results  Component Value Date   ALT 7 03/12/2020   AST 16 03/12/2020   ALKPHOS 58 06/16/2016   BILITOT 0.4 03/12/2020    Lab Results  Component Value Date   CHOL 134 02/04/2017   HDL 68 02/04/2017   LDLCALC 45 02/04/2017   TRIG 127 02/04/2017   CHOLHDL 2.0 02/04/2017   HIV 1 RNA Quant  Date Value  03/12/2020 22,900 Copies/mL (H)  10/03/2019 6,590 copies/mL (H)  08/10/2019 2,260 copies/mL (H)   CD4 T Cell Abs (/uL)  Date Value  03/12/2020 169 (L)  08/10/2019 180 (L)  04/18/2019 204 (L)     Assessment & Plan:   Patient Active Problem List   Diagnosis Date Noted   Healthcare maintenance 12/13/2018   AIDS (acquired immune deficiency syndrome) ([place]) 10/30/2018   Grief reaction 10/30/2018   Tobacco abuse 11/10/2011   Hypertension 11/10/2011   LGSIL of cervix of undetermined significance 01/01/2011   Human immunodeficiency virus (HIV) disease ([place]) 09/28/2006   History of syphilis 09/28/2006   Alcohol abuse 09/28/2006   History of hepatitis B 09/28/2006    Problem List Items Addressed This Visit   None  Janene Madeira, MSN, NP-C [place] for Infect[facility] [place] Pager:  [phone] Office: [phone]  07/27/20  3:33 PM

## 2020-07-30 ENCOUNTER — Ambulatory Visit: Payer: Self-pay | Admitting: Infectious Diseases

## 2020-08-01 ENCOUNTER — Other Ambulatory Visit: Payer: Self-pay | Admitting: Family

## 2020-08-01 DIAGNOSIS — B20 Human immunodeficiency virus [HIV] disease: Secondary | ICD-10-CM

## 2020-08-10 ENCOUNTER — Encounter: Payer: Self-pay | Admitting: Infectious Diseases

## 2020-08-10 ENCOUNTER — Ambulatory Visit (INDEPENDENT_AMBULATORY_CARE_PROVIDER_SITE_OTHER): Payer: Self-pay | Admitting: Infectious Diseases

## 2020-08-10 ENCOUNTER — Other Ambulatory Visit: Payer: Self-pay

## 2020-08-10 VITALS — BP 132/74 | HR 97 | Temp 97.9°F | Wt 131.0 lb

## 2020-08-10 DIAGNOSIS — R29898 Other symptoms and signs involving the musculoskeletal system: Secondary | ICD-10-CM

## 2020-08-10 DIAGNOSIS — B20 Human immunodeficiency virus [HIV] disease: Secondary | ICD-10-CM

## 2020-08-10 DIAGNOSIS — F1010 Alcohol abuse, uncomplicated: Secondary | ICD-10-CM

## 2020-08-10 DIAGNOSIS — I10 Essential (primary) hypertension: Secondary | ICD-10-CM

## 2020-08-10 NOTE — Patient Instructions (Addendum)
Please get your last COVID booster at any time you are able.   Please continue your Symtuza once a day, Tivicay twice a day and Bactrim once a day.   Continue your atenolol and amlodipine for your blood pressure.   Flu shot in October   Family Medical Leave Act [facility]) - this is what you need to ask from your HR so you have the documentation you need to keep your job.  We can write for you to have 2-3 days a month.   Office number [phone] Office fax [phone]  [facility] Team offers grief counseling Phone: [phone]

## 2020-08-10 NOTE — Assessment & Plan Note (Signed)
Has decreased her alcohol use since her last visit.  She drinks about a 40 ounce beer every evening to relax. CAGE screen negative. I will check LFTs today. Continued to support her to decrease to least possible use and if anyone around her tells her she has a problem she should use this as an indicator to seek help.

## 2020-08-10 NOTE — Assessment & Plan Note (Signed)
We spent time reviewing historical labs today and last VL of 22,000 in February. She does acknowledge she has had a hard time taking her treatment but has done very well since that time and estimates no missed doses. She describes taking her medications correctly with Symtuza once daily in AM with food and Tivicay twice daily.  Will check pertinent labs today to monitor for therapeutic effect.

## 2020-08-10 NOTE — Assessment & Plan Note (Signed)
Last CD4 count about 150.  We will redraw this today and have her continue Bactrim once daily. No AIDS defining conditions on exam today. She has regained about 10 pounds indicating she is having some recovery of her health.

## 2020-08-10 NOTE — Progress Notes (Signed)
Name: Christine Dawson  DOB: 11-10-1965 MRN: [id number] PCP: Patient, No Pcp Per (Inactive)     Brief Narrative:  Christine Dawson is a 55 y.o. female with HIV, +AIDS, Dx 09-2006. 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 See Cumulative data report in Overview of Problem.    Subjective:   Chief Complaint  Patient presents with   Follow-up    B20      HPI: Christine Dawson is here for routine follow up. Has been busy with her new job and moving lately so she apologized for missing a few appointments. Doing well with Symtuza + BID Tivicay. She does not think she has missed any doses since February.  She does tell me that she was not taking her medication at that time which explains her very high viral load.  She continues to take the Bactrim as well once daily as well as her blood pressure medications.  She feels good that her labs will be good today. Overall reports improved energy and has gained about 10 lbs which she is happy about.   Main concern today is intermittent weakness in the lower legs. Feels off balance in her legs. Came home from work yesterday and just felt like she was going to fall. Requesting FMLA to help with keeping her job to support her. She does estimate these episodes happen 2-3 times a month where she feels like she is fatigued and needs an extra day off. She loves her job and would like to continue her current schedule. She is currently able to complete all required activities  She has a new apartment with a dog.  She is enjoying her own personal space and likes when the grandkids come around.   She enjoys her job at [employer] very much.  Smoking only occasional marijuana, no other drug use, and has cut her drinking down to about one 40 ounce beer a day.  She never drinks to get drunk only to relax.  Nobody around her tells her that she has a problem with alcohol.  Has not received her second COVID booster yet.   Review of Systems  Constitutional:  Positive for fatigue. Negative for appetite change, chills, fever and unexpected weight change.  HENT:  Negative for mouth sores, sore throat and trouble swallowing.   Eyes:  Negative for pain and visual disturbance.  Respiratory:  Negative for cough and shortness of breath.   Cardiovascular:  Negative for chest pain.  Gastrointestinal:  Negative for abdominal pain, diarrhea and nausea.  Genitourinary:  Negative for dysuria, menstrual problem and pelvic pain.  Musculoskeletal:  Negative for back pain, gait problem and neck pain.  Skin:  Negative for color change and rash.  Allergic/Immunologic: Positive for immunocompromised state.  Neurological:  Positive for light-headedness. Negative for weakness, numbness and headaches.  Hematological:  Negative for adenopathy.  Psychiatric/Behavioral:  Negative for dysphoric mood. The patient is not nervous/anxious.     Past Medical History:  Diagnosis Date   Abnormal Pap smear 08/2010   HIV infection [facility])     Outpatient  Medications Prior to Visit  Medication Sig Dispense Refill   amLODipine (NORVASC) 10 MG tablet Take 1 tablet (10 mg total) by mouth daily. 30 tablet 11   atenolol (TENORMIN) 50 MG tablet Take 1 tablet (50 mg total) by mouth daily. 30 tablet 11   sulfamethoxazole-trimethoprim (BACTRIM DS) 800-160 MG tablet TAKE 1 TABLET BY MOUTH 3 TIMES A WEEK 30 tablet 5   SYMTUZA 800-150-200-10 MG TABS TAKE 1 TABLET BY MOUTH DAILY WITH BREAKFAST 30 tablet 0   TIVICAY 50 MG tablet TAKE 1 TABLET(50 MG) BY MOUTH TWICE DAILY 60 tablet 0   No facility-administered medications prior to visit.      No Known Allergies  Social History   Tobacco Use   Smoking status: Every Day    Packs/day: 0.50    Pack years: 0.00    Types: Cigarettes   Smokeless tobacco: Never   Tobacco comments:    cutting back  Substance Use Topics   Alcohol use: Yes    Alcohol/week: 21.0 standard drinks    Types: 21 Standard drinks or equivalent per week    Comment: beer   Drug use: Yes    Types: Marijuana    Social History   Substance and Sexual Activity  Sexual Activity Not Currently   Comment: declined condoms     Objective:   Vitals:   08/10/20 1147  BP: 132/74  Pulse: 97  Temp: 97.9 F (36.6 C)  TempSrc: Oral  Weight: 131 lb (59.4 kg)   Body mass index is 23.21 kg/m.   Physical Exam HENT:     Mouth/Throat:     Mouth: No oral lesions.     Dentition: No dental abscesses.  Cardiovascular:     Rate and Rhythm: Normal rate and regular rhythm.     Heart sounds: Normal heart sounds.  Pulmonary:     Effort: Pulmonary effort is normal.     Breath sounds: Normal breath sounds.  Abdominal:     General: There is no distension.     Palpations: Abdomen is soft.     Tenderness: There is no abdominal tenderness.  Musculoskeletal:        General: No tenderness. Normal range of motion.  Lymphadenopathy:     Cervical: No cervical adenopathy.  Skin:    General: Skin is warm and dry.     Findings: No rash.  Neurological:     Mental Status: She is alert and oriented to person, place, and time.     Sensory: No sensory deficit.     Motor: No weakness.     Coordination: Coordination normal.     Gait: Gait normal.     Deep Tendon Reflexes: Reflexes normal.  Psychiatric:        Mood and Affect: Mood normal.        Behavior: Behavior normal.        Judgment: Judgment normal.    Lab Results Lab Results  Component Value Date   WBC 3.7 (L) 08/10/2020   HGB 12.6 08/10/2020   HCT 39.0 08/10/2020   MCV 90.3 08/10/2020   PLT 185 08/10/2020    Lab Results  Component Value Date    CREATININE 0.91 08/10/2020   BUN 9 08/10/2020   NA 138 08/10/2020   K 4.8 08/10/2020   CL 104 08/10/2020   CO2 24 08/10/2020    Lab Results  Component Value Date   ALT 10 08/10/2020   AST 21 08/10/2020   ALKPHOS 58 06/16/2016   BILITOT  0.4 08/10/2020    Lab Results  Component Value Date   CHOL 134 02/04/2017   HDL 68 02/04/2017   LDLCALC 45 02/04/2017   TRIG 127 02/04/2017   CHOLHDL 2.0 02/04/2017   HIV 1 RNA Quant  Date Value  03/12/2020 22,900 Copies/mL (H)  10/03/2019 6,590 copies/mL (H)  08/10/2019 2,260 copies/mL (H)   CD4 T Cell Abs (/uL)  Date Value  03/12/2020 169 (L)  08/10/2019 180 (L)  04/18/2019 204 (L)     Assessment & Plan:   Patient Active Problem List   Diagnosis Date Noted   Lower extremity weakness 08/11/2020   Healthcare maintenance 12/13/2018   AIDS (acquired immune deficiency syndrome) ([place]) 10/30/2018   Tobacco abuse 11/10/2011   Hypertension 11/10/2011   LGSIL of cervix of undetermined significance 01/01/2011   Human immunodeficiency virus (HIV) disease ([place]) 09/28/2006   History of syphilis 09/28/2006   Alcohol abuse 09/28/2006   History of hepatitis B 09/28/2006    Problem List Items Addressed This Visit       Unprioritized   Hypertension (Chronic)    BP Readings from Last 1 Encounters:  08/10/20 132/74  Excellent blood pressure today - I will have her continue her Amlodipine 10 mg and Atenolol 50 mg once daily.       Human immunodeficiency virus (HIV) disease ([place]) (Chronic)    We spent time reviewing historical labs today and last VL of 22,000 in February. She does acknowledge she has had a hard time taking her treatment but has done very well since that time and estimates no missed doses. She describes taking her medications correctly with Symtuza once daily in AM with food and Tivicay twice daily.  Will check pertinent labs today to monitor for therapeutic effect.        Lower extremity weakness    Exam is normal  today. She reports adequate sensation bilaterally.  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  We discussed having her reach out to her employer's HR department to ask for intermittent FMLA paperwork to be sent her so we can complete 3 days per month. Otherwise no need for restricted work at this time.        Alcohol abuse    Has decreased her alcohol use since her last visit.  She drinks about a 40 ounce beer every evening to relax. CAGE screen negative. I will check LFTs today. Continued to support her to decrease to least possible use and if anyone around her tells her she has a problem she should use this as an indicator to seek help.        AIDS (acquired immune deficiency syndrome) ([place]) - Primary    Last CD4 count about 150.  We will redraw this today and have her continue Bactrim once daily. No AIDS defining conditions on exam today. She has regained about 10 pounds indicating she is having some recovery of her health.        Relevant Orders   HIV-1 RNA quant-no reflex-bld   COMPLETE METABOLIC PANEL WITH GFR (Completed)   CBC with Differential/Platelet (Completed)   T-helper cells (CD4) count   Janene Madeira, MSN, NP-C [facility] for Infect[facility] [place] Pager: [phone] Office: [phone]  08/11/20  7:15 PM

## 2020-08-11 DIAGNOSIS — R29898 Other symptoms and signs involving the musculoskeletal system: Secondary | ICD-10-CM | POA: Insufficient documentation

## 2020-08-11 NOTE — Assessment & Plan Note (Signed)
Exam is normal today. She reports adequate sensation bilaterally.  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  We discussed having her reach out to her employer's HR department to ask for intermittent FMLA paperwork to be sent her so we can complete 3 days per month. Otherwise no need for restricted work at this time.

## 2020-08-11 NOTE — Assessment & Plan Note (Signed)
BP Readings from Last 1 Encounters:  08/10/20 132/74   Excellent blood pressure today - I will have her continue her Amlodipine 10 mg and Atenolol 50 mg once daily.

## 2020-08-13 ENCOUNTER — Telehealth: Payer: Self-pay

## 2020-08-13 LAB — CBC WITH DIFFERENTIAL/PLATELET
Absolute Monocytes: 440 cells/uL (ref 200–950)
Basophils Absolute: 30 cells/uL (ref 0–200)
Basophils Relative: 0.8 %
Eosinophils Absolute: 118 cells/uL (ref 15–500)
Eosinophils Relative: 3.2 %
HCT: 39 % (ref 35.0–45.0)
Hemoglobin: 12.6 g/dL (ref 11.7–15.5)
Lymphs Abs: 1140 cells/uL (ref 850–3900)
MCH: 29.2 pg (ref 27.0–33.0)
MCHC: 32.3 g/dL (ref 32.0–36.0)
MCV: 90.3 fL (ref 80.0–100.0)
MPV: 9.5 fL (ref 7.5–12.5)
Monocytes Relative: 11.9 %
Neutro Abs: 1972 cells/uL (ref 1500–7800)
Neutrophils Relative %: 53.3 %
Platelets: 185 10*3/uL (ref 140–400)
RBC: 4.32 10*6/uL (ref 3.80–5.10)
RDW: 13.4 % (ref 11.0–15.0)
Total Lymphocyte: 30.8 %
WBC: 3.7 10*3/uL — ABNORMAL LOW (ref 3.8–10.8)

## 2020-08-13 LAB — COMPLETE METABOLIC PANEL WITH GFR
AG Ratio: 1.2 (calc) (ref 1.0–2.5)
ALT: 10 U/L (ref 6–29)
AST: 21 U/L (ref 10–35)
Albumin: 4.3 g/dL (ref 3.6–5.1)
Alkaline phosphatase (APISO): 77 U/L (ref 37–153)
BUN: 9 mg/dL (ref 7–25)
CO2: 24 mmol/L (ref 20–32)
Calcium: 9.3 mg/dL (ref 8.6–10.4)
Chloride: 104 mmol/L (ref 98–110)
Creat: 0.91 mg/dL (ref 0.50–1.05)
GFR, Est African American: 82 mL/min/{1.73_m2} (ref >=60)
GFR, Est Non African American: 71 mL/min/{1.73_m2} (ref >=60)
Globulin: 3.5 g/dL (calc) (ref 1.9–3.7)
Glucose, Bld: 86 mg/dL (ref 65–99)
Potassium: 4.8 mmol/L (ref 3.5–5.3)
Sodium: 138 mmol/L (ref 135–146)
Total Bilirubin: 0.4 mg/dL (ref 0.2–1.2)
Total Protein: 7.8 g/dL (ref 6.1–8.1)

## 2020-08-13 LAB — HIV-1 RNA QUANT-NO REFLEX-BLD
HIV 1 RNA Quant: 91900 Copies/mL — ABNORMAL HIGH
HIV-1 RNA Quant, Log: 4.96 Log cps/mL — ABNORMAL HIGH

## 2020-08-13 LAB — T-HELPER CELLS (CD4) COUNT (NOT AT ARMC)
Absolute CD4: 118 cells/uL — ABNORMAL LOW (ref 490–1740)
CD4 T Helper %: 10 % — ABNORMAL LOW (ref 30–61)
Total lymphocyte count: 1165 cells/uL (ref 850–3900)

## 2020-08-13 NOTE — Progress Notes (Signed)
Please call Christine Dawson to let her know that her viral load is very high at 91,900 and her immune system is getting worse. I would like to offer her a follow up with me in 1 month so we can see if she is doing any better on her medications at that time. I am worried she is not taking her medication and don't want her to get sicker.  Please have her take the symtuza once daily, tivicay twice daily and her bactrim once daily like we talked about.   I am happy to call and talk with her more if we can arrange that with her around her work schedule.

## 2020-08-13 NOTE — Telephone Encounter (Signed)
Left a VM on secure line asking patient to call office for results.

## 2020-08-13 NOTE — Telephone Encounter (Signed)
-----   Message from Letta Median, [place] sent at 08/13/2020  2:44 PM EDT ----- Left VM to call regarding labs.  ----- Message ----- From: Blanchard Kelch, NP Sent: 08/13/2020   2:31 PM EDT To: [facility] Triage Nurse Pool  Please call Christine Dawson to let her know that her viral load is very high at 91,900 and her immune system is getting worse. I would like to offer her a follow up with me in 1 month so we can see if she is doing any better on her medications at that time. I am worried she is not taking her medication and don't want her to get sicker.  Please have her take the symtuza once daily, tivicay twice daily and her bactrim once daily like we talked about.   I am happy to call and talk with her more if we can arrange that with her around her work schedule.

## 2020-08-14 NOTE — Telephone Encounter (Signed)
Patient informed of test results. RN reviewed treatment plan with her and scheduled 1 month  follow up with Judeth Cornfield for additional labs + counseling. Patient verbalized understanding and had no further questions/concerns.   Saoirse Legere Loyola Mast, RN

## 2020-08-17 ENCOUNTER — Other Ambulatory Visit: Payer: Self-pay | Admitting: Infectious Diseases

## 2020-08-17 DIAGNOSIS — B20 Human immunodeficiency virus [HIV] disease: Secondary | ICD-10-CM

## 2020-08-30 ENCOUNTER — Telehealth: Payer: Self-pay

## 2020-08-30 NOTE — Telephone Encounter (Signed)
Patient called to ask if Christine Dawson had sent in her note for work so that she can leave early three days a week. RN advised her that she will need to have her employer fax FMLA paperwork to our office and provided her with triage fax number. Patient verbalized understanding and has no further questions.   Sandie Ano, RN

## 2020-09-10 ENCOUNTER — Other Ambulatory Visit: Payer: Self-pay | Admitting: Infectious Diseases

## 2020-09-10 DIAGNOSIS — B20 Human immunodeficiency virus [HIV] disease: Secondary | ICD-10-CM

## 2020-09-24 ENCOUNTER — Ambulatory Visit: Payer: Self-pay | Admitting: Infectious Diseases

## 2020-10-01 ENCOUNTER — Other Ambulatory Visit: Payer: Self-pay | Admitting: Infectious Diseases

## 2020-10-01 DIAGNOSIS — B20 Human immunodeficiency virus [HIV] disease: Secondary | ICD-10-CM

## 2020-11-14 ENCOUNTER — Other Ambulatory Visit: Payer: Self-pay | Admitting: Infectious Diseases

## 2020-11-14 DIAGNOSIS — B20 Human immunodeficiency virus [HIV] disease: Secondary | ICD-10-CM

## 2020-11-19 ENCOUNTER — Ambulatory Visit: Payer: Self-pay | Admitting: Infectious Diseases

## 2020-11-29 ENCOUNTER — Other Ambulatory Visit: Payer: Self-pay

## 2020-11-29 ENCOUNTER — Encounter: Payer: Self-pay | Admitting: Infectious Diseases

## 2020-11-29 ENCOUNTER — Ambulatory Visit (INDEPENDENT_AMBULATORY_CARE_PROVIDER_SITE_OTHER): Payer: Self-pay | Admitting: Infectious Diseases

## 2020-11-29 VITALS — BP 151/80 | HR 62 | Temp 96.1°F | Wt 133.0 lb

## 2020-11-29 DIAGNOSIS — B20 Human immunodeficiency virus [HIV] disease: Secondary | ICD-10-CM

## 2020-11-29 DIAGNOSIS — Z0000 Encounter for general adult medical examination without abnormal findings: Secondary | ICD-10-CM

## 2020-11-29 DIAGNOSIS — Z23 Encounter for immunization: Secondary | ICD-10-CM

## 2020-11-29 DIAGNOSIS — I10 Essential (primary) hypertension: Secondary | ICD-10-CM

## 2020-11-29 MED ORDER — SULFAMETHOXAZOLE-TRIMETHOPRIM 800-160 MG PO TABS: ORAL_TABLET | ORAL | 11 refills | Status: DC

## 2020-11-29 NOTE — Assessment & Plan Note (Signed)
Flu vaccine today 

## 2020-11-29 NOTE — Assessment & Plan Note (Signed)
BP up today - sounds like she is only taking 1 BP pill. We reviewed that she should be on 2 of them regularly (Atenoll 50 mg + amlodipine 10 mg). Will need to monitor with concurrent symtuza as it can boost effect of amlodipine.   Will ask Pharmacy team to repeat BP for her at upcoming OV.

## 2020-11-29 NOTE — Patient Instructions (Addendum)
For your Medications -   Morning -  Tivicay (the small yellow circle pill) --> this is TWICE a day for you.  TWO blood pressure pills --> Amlodipine and Atenolol    Lunch -  Symtuza (the bigger yellow pill) with food   Before Bed -  White pill (this is the antibiotic) Tivicay (small yellow circle pill) --> this is TWICE a day for you.   Please come back to review your labs with our pharmacist [name] on Monday November 7th at 1:45   There is a good chance we need to change your HIV medication.

## 2020-11-29 NOTE — Assessment & Plan Note (Addendum)
Realized today that Christine Dawson is only taking Tivicay once a day for ART. I am not sure why she has not been getting Symtuza but it appears that the specialty pharmacy has been regularly sending this to her. I got her a 30-day sample bottle today and asked her to start taking this once daily with food at lunch while at work. This will be most consistent for her.  She will add Tivicay in the morning with her blood pressure medications AND continue the dose before bed --> though I worry this is not at all active for her now given it was already intermediately resistant for her.   Will check genotype today with integrase assay. I think we may be able to get away with just Symtuza once a day for her but will see what Genotype results show --> may need to add fostemsavir BID. She will come back on Monday Nov 7th to meet with [name] and review new treatment plan for ARVs.

## 2020-11-29 NOTE — Assessment & Plan Note (Signed)
She will continue taking her Bactrim at night as she has been doing. CD4 has been decreasing and last check was February 2022 169. No findings concerning for OI today.  Weight has been stable.

## 2020-11-29 NOTE — Progress Notes (Signed)
Name: Christine Dawson  DOB: 05/04/1965 MRN: [id number] PCP: Patient, No Pcp Per (Inactive)     Brief Narrative:  Christine Dawson is a 55 y.o. female with HIV, +AIDS, Dx 09-2006. 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 See Cumulative data report in Overview of Problem.    Subjective:   Chief Complaint  Patient presents with   Follow-up    B20     HPI: Christine Dawson is here for follow up care. She does not have her medications with her today but points to a pill chart to help me see what she is taking. She indicates Tivicay once a day and Bactrim in the evenings. She does not recall any Symtuza being sent to her. She is taking one blood pressure medication at lunch time at work.   Still working at [employer] and enjoys it there. Requesting flu shot today.    Review of Systems  Constitutional:  Negative for appetite change, chills, fatigue, fever and unexpected weight change.  HENT:  Negative for mouth sores, sore throat and trouble swallowing.   Eyes:  Negative for pain and visual disturbance.  Respiratory:  Negative for cough and shortness of breath.   Cardiovascular:  Negative for chest pain.  Gastrointestinal:  Negative for abdominal pain, diarrhea and nausea.  Genitourinary:  Negative for dysuria, menstrual problem and pelvic pain.  Musculoskeletal:  Negative for back pain, gait problem and neck pain.  Skin:  Negative for color change and rash.  Allergic/Immunologic: Positive for immunocompromised state.  Neurological:  Negative for weakness, light-headedness, numbness and headaches.  Hematological:   Negative for adenopathy.  Psychiatric/Behavioral:  Negative for dysphoric mood. The patient is not nervous/anxious.     Past Medical History:  Diagnosis Date   Abnormal Pap smear 08/2010   HIV infection [facility])     Outpatient Medications Prior to Visit  Medication Sig Dispense Refill   amLODipine (NORVASC) 10 MG tablet Take 1 tablet (10 mg total) by mouth daily. 30 tablet 11   atenolol (TENORMIN) 50 MG tablet Take 1 tablet (50 mg total) by mouth daily. 30 tablet 11   SYMTUZA 800-150-200-10 MG TABS TAKE 1 TABLET BY MOUTH DAILY WITH BREAKFAST 30 tablet 0   TIVICAY 50 MG tablet TAKE 1 TABLET(50 MG) BY MOUTH TWICE DAILY 60 tablet 0   sulfamethoxazole-trimethoprim (BACTRIM DS) 800-160 MG tablet TAKE 1 TABLET BY MOUTH 3 TIMES A WEEK 30 tablet 5   No facility-administered medications prior to visit.     No Known Allergies  Social History   Tobacco Use   Smoking status: Every Day    Packs/day: 0.50    Types: Cigarettes   Smokeless tobacco: Never   Tobacco comments:    cutting back  Substance Use Topics   Alcohol use: Yes    Alcohol/week: 21.0 standard drinks    Types: 21 Standard drinks or equivalent per week    Comment: beer   Drug use: Yes    Types: Marijuana    Social History   Substance and Sexual Activity  Sexual Activity Not Currently  Comment: declined condoms     Objective:   Vitals:   11/29/20 1014  BP: (!) 151/80  Pulse: 62  Temp: (!) 96.1 F (35.6 C)  TempSrc: Temporal  Weight: 133 lb (60.3 kg)   Body mass index is 23.56 kg/m.   Physical Exam HENT:     Mouth/Throat:     Mouth: No oral lesions.     Dentition: No dental abscesses.  Cardiovascular:     Rate and Rhythm: Normal rate and regular rhythm.     Heart sounds: Normal heart sounds.  Pulmonary:     Effort: Pulmonary effort is normal.     Breath sounds: Normal breath sounds.  Abdominal:     General: There is no distension.     Palpations: Abdomen is soft.     Tenderness: There is no  abdominal tenderness.  Musculoskeletal:        General: No tenderness. Normal range of motion.  Lymphadenopathy:     Cervical: No cervical adenopathy.  Skin:    General: Skin is warm and dry.     Findings: No rash.  Neurological:     Mental Status: She is alert and oriented to person, place, and time.     Sensory: No sensory deficit.     Motor: No weakness.     Coordination: Coordination normal.     Gait: Gait normal.     Deep Tendon Reflexes: Reflexes normal.  Psychiatric:        Mood and Affect: Mood normal.        Behavior: Behavior normal.        Judgment: Judgment normal.    Lab Results Lab Results  Component Value Date   WBC 3.7 (L) 08/10/2020   HGB 12.6 08/10/2020   HCT 39.0 08/10/2020   MCV 90.3 08/10/2020   PLT 185 08/10/2020    Lab Results  Component Value Date   CREATININE 0.91 08/10/2020   BUN 9 08/10/2020   NA 138 08/10/2020   K 4.8 08/10/2020   CL 104 08/10/2020   CO2 24 08/10/2020    Lab Results  Component Value Date   ALT 10 08/10/2020   AST 21 08/10/2020   ALKPHOS 58 06/16/2016   BILITOT 0.4 08/10/2020    Lab Results  Component Value Date   CHOL 134 02/04/2017   HDL 68 02/04/2017   LDLCALC 45 02/04/2017   TRIG 127 02/04/2017   CHOLHDL 2.0 02/04/2017   HIV 1 RNA Quant  Date Value  08/10/2020 91,900 Copies/mL (H)  03/12/2020 22,900 Copies/mL (H)  10/03/2019 6,590 copies/mL (H)   CD4 T Cell Abs (/uL)  Date Value  03/12/2020 169 (L)  08/10/2019 180 (L)  04/18/2019 204 (L)     Assessment & Plan:   Patient Active Problem List   Diagnosis Date Noted   Lower extremity weakness 08/11/2020   Healthcare maintenance 12/13/2018   AIDS (acquired immune deficiency syndrome) ([place]) 10/30/2018   Tobacco abuse 11/10/2011   Hypertension 11/10/2011   LGSIL of cervix of undetermined significance 01/01/2011   Human immunodeficiency virus (HIV) disease ([place]) 09/28/2006   History of syphilis 09/28/2006   Alcohol abuse 09/28/2006   History of  hepatitis B 09/28/2006    Problem List Items Addressed This Visit       Unprioritized   Hypertension (Chronic)    BP up today - sounds like she is only taking 1 BP pill. We reviewed that she should be on 2 of them regularly (Atenoll 50 mg + amlodipine  10 mg). Will need to monitor with concurrent symtuza as it can boost effect of amlodipine.   Will ask Pharmacy team to repeat BP for her at [place].       Human immunodeficiency virus (HIV) disease ([place]) (Chronic)    Realized today that Quantina is only taking Tivicay once a day for ART. I am not sure why she has not been getting Symtuza but it appears that the specialty pharmacy has been regularly sending this to her. I got her a 30-day sample bottle today and asked her to start taking this once daily with food at lunch while at work. This will be most consistent for her.  She will add Tivicay in the morning with her blood pressure medications AND continue the dose before bed --> though I worry this is not at all active for her now given it was already intermediately resistant for her.   Will check genotype today with integrase assay. I think we may be able to get away with just Symtuza once a day for her but will see what Genotype results show --> may need to add fostemsavir BID. She will come back on Monday Nov 7th to meet with [name] and review new treatment plan for ARVs.       Relevant Medications   sulfamethoxazole-trimethoprim (BACTRIM DS) 800-160 MG tablet   Healthcare maintenance    Flu vaccine today.       AIDS (acquired immune deficiency syndrome) ([place]) - Primary    She will continue taking her Bactrim at night as she has been doing. CD4 has been decreasing and last check was February 2022 169. No findings concerning for OI today.  Weight has been stable.       Relevant Medications   sulfamethoxazole-trimethoprim (BACTRIM DS) 800-160 MG tablet   Other Relevant Orders   HIV RNA, RTPCR W/R GT (RTI, PI,INT)  Janene Madeira,  MSN, NP-C [facility] [place] Pager: [phone] Office: [phone]  11/29/20  11:16 AM

## 2020-12-07 ENCOUNTER — Telehealth: Payer: Self-pay

## 2020-12-07 LAB — HIV-1 INTEGRASE GENOTYPE

## 2020-12-07 LAB — HIV RNA, RTPCR W/R GT (RTI, PI,INT)
HIV 1 RNA Quant: 59500 copies/mL — ABNORMAL HIGH
HIV-1 RNA Quant, Log: 4.77 Log copies/mL — ABNORMAL HIGH

## 2020-12-07 LAB — HIV-1 GENOTYPE: HIV-1 Genotype: DETECTED — AB

## 2020-12-07 NOTE — Telephone Encounter (Signed)
Left patient a voice mail to call back to reschedule appointment she has scheduled for 11/7 With [name]. By Pharmacist she is scheduled for monday and her lab tests arent back yet and the reason for the visit is to go over her lab work. Needs to be pushed out a week

## 2020-12-10 ENCOUNTER — Ambulatory Visit: Payer: Self-pay | Admitting: Pharmacist

## 2020-12-13 ENCOUNTER — Telehealth: Payer: Self-pay | Admitting: Pharmacist

## 2020-12-13 NOTE — Telephone Encounter (Signed)
Cumulative HIV Genotype Data  RT Mutations  M184V, K101E, E138K, T69N, V118I, R211K, F214L  PI Mutations  L10V, E35D, M36I, I62V, l63AS  Integrase Mutations  G140S, Q148H, M50I, D232E, E138K, N155H, D232E   Interpretation of Genotype Data per [employer] HIV Drug Resistance Database:  Nucleoside RTIs  Abacavir - low level resistance Zidovudine - susceptible Emtricitabine - high level resistance Lamivudine - high level resistance Tenofovir - susceptible   Non-Nucleoside RTIs  Doravirine - low level resistance Efavirenz - low level resistance Etravirine - low level resistance Nevirapine - intermediate resistance Rilpivirine - high level resistance   Protease Inhibitors  Atazanavir - susceptible Darunavir - susceptible Lopinavir - susceptible   Integrase Inhibitors  Bictegravir - high level resistance Cabotegravir - high level resistance Dolutegravir - high level resistance Elvitegravir - high level resistance Raltegravir - high level resistance   Auther Lyerly L. Ruxin Ransome, PharmD, BCIDP, AAHIVP, CPP Clinical Pharmacist Practitioner Infectious Diseases Clinical Pharmacist [facility] 12/13/2020, 9:45 AM

## 2020-12-13 NOTE — Progress Notes (Signed)
Yeah, she has always had integrase R but now she's got high level R to all integrase medications.   They had her on my schedule this past Monday, but I had to reschedule her to next Monday because her genotype wasn't back yet. I suppose Symtuza would be the best bet since she has trouble with multiple pills. We can try and see!

## 2020-12-17 ENCOUNTER — Ambulatory Visit: Payer: Self-pay | Admitting: Pharmacist

## 2020-12-25 ENCOUNTER — Other Ambulatory Visit: Payer: Self-pay | Admitting: Infectious Diseases

## 2020-12-25 DIAGNOSIS — B20 Human immunodeficiency virus [HIV] disease: Secondary | ICD-10-CM

## 2020-12-26 ENCOUNTER — Encounter: Payer: Self-pay | Admitting: Pharmacist

## 2020-12-26 NOTE — Telephone Encounter (Signed)
Can send in refills for now. I will reach out to patient to ensure she is taking both Tivicay BID AND Symtuza and reschedule her follow-up with [name]. Thanks!

## 2020-12-26 NOTE — Telephone Encounter (Signed)
Please advise, last office note mentions possibly adding [place] based on resistance testing.

## 2021-01-09 ENCOUNTER — Telehealth: Payer: Self-pay | Admitting: Pharmacist

## 2021-01-09 NOTE — Telephone Encounter (Signed)
LVM with patient to call back regarding medicine. Want to confirm she is taking her HIV regimen correctly, and she needs to follow-up here with [place] or [place] soon.  Margarite Gouge, PharmD, CPP Clinical Pharmacist Practitioner Infectious Diseases Clinical Pharmacist [facility]

## 2021-02-06 ENCOUNTER — Other Ambulatory Visit: Payer: Self-pay | Admitting: Infectious Diseases

## 2021-02-06 DIAGNOSIS — B20 Human immunodeficiency virus [HIV] disease: Secondary | ICD-10-CM

## 2021-02-06 DIAGNOSIS — I10 Essential (primary) hypertension: Secondary | ICD-10-CM

## 2021-02-06 NOTE — Telephone Encounter (Signed)
30-d refill sent in for requested meds. Please have her come to an appt with me or pharmacy - whomever we can get her with to accommodate work schedule. I cant recall if it's mondays or fridays she has off.

## 2021-02-06 NOTE — Telephone Encounter (Signed)
Please advise on refill. Will try to schedule follow up appt.

## 2021-02-07 ENCOUNTER — Telehealth: Payer: Self-pay

## 2021-02-07 NOTE — Telephone Encounter (Signed)
-----   Message from Sandie Ano, RN sent at 02/07/2021  8:40 AM EST ----- Judeth Cornfield would like patient scheduled with either herself or pharmacy for follow up within the next few weeks, thanks!

## 2021-02-07 NOTE — Telephone Encounter (Signed)
Patient accepts follow up visit scheduled 02/18/21 with Doren Custard, NP. Christine Dawson

## 2021-02-18 ENCOUNTER — Ambulatory Visit: Payer: Self-pay | Admitting: Infectious Diseases

## 2021-02-19 NOTE — Telephone Encounter (Signed)
Called patient to reschedule missed appointment with Durwin Nora, NP on 02/18/21. Patient stated she was unable to make it to her appointment due to her girlfriend passing away yesterday. Staff expressed condolences and offered an appointment with counselor.  Patient declined at this time. Patient accepts appointment with pharmacy team next Monday. Valarie Cones

## 2021-02-19 NOTE — Telephone Encounter (Signed)
Thank you :)

## 2021-02-25 ENCOUNTER — Ambulatory Visit (INDEPENDENT_AMBULATORY_CARE_PROVIDER_SITE_OTHER): Payer: Self-pay | Admitting: Pharmacist

## 2021-02-25 ENCOUNTER — Encounter: Payer: Self-pay | Admitting: Infectious Diseases

## 2021-02-25 ENCOUNTER — Other Ambulatory Visit: Payer: Self-pay

## 2021-02-25 DIAGNOSIS — B20 Human immunodeficiency virus [HIV] disease: Secondary | ICD-10-CM

## 2021-02-25 MED ORDER — SYMTUZA 800-150-200-10 MG PO TABS: 1.0000 | ORAL_TABLET | Freq: Every day | ORAL | 5 refills | Status: DC

## 2021-02-25 NOTE — Progress Notes (Signed)
02/25/2021  HPI: Christine Dawson is a 56 y.o. female who presents to the [facility] pharmacy clinic for HIV follow-up.  Patient Active Problem List   Diagnosis Date Noted   Lower extremity weakness 08/11/2020   Healthcare maintenance 12/13/2018   AIDS (acquired immune deficiency syndrome) ([facility]) 10/30/2018   Tobacco abuse 11/10/2011   Hypertension 11/10/2011   LGSIL of cervix of undetermined significance 01/01/2011   Human immunodeficiency virus (HIV) disease ([facility]) 09/28/2006   History of syphilis 09/28/2006   Alcohol abuse 09/28/2006   History of hepatitis B 09/28/2006    Patient's Medications  New Prescriptions   No medications on file  Previous Medications   AMLODIPINE (NORVASC) 10 MG TABLET    TAKE 1 TABLET BY MOUTH EVERY DAY   ATENOLOL (TENORMIN) 50 MG TABLET    TAKE 1 TABLET(50 MG) BY MOUTH DAILY   SULFAMETHOXAZOLE-TRIMETHOPRIM (BACTRIM DS) 800-160 MG TABLET    Take 1 tablet once a day  Modified Medications   Modified Medication Previous Medication   DARUNAVIR-COBICISTAT-EMTRICITABINE-TENOFOVIR ALAFENAMIDE (SYMTUZA) 800-150-200-10 MG TABS SYMTUZA 800-150-200-10 MG TABS      Take 1 tablet by mouth daily with breakfast.    TAKE 1 TABLET BY MOUTH DAILY WITH BREAKFAST  Discontinued Medications   TIVICAY 50 MG TABLET    TAKE 1 TABLET(50 MG) BY MOUTH TWICE DAILY    Allergies: No Known Allergies  Past Medical History: Past Medical History:  Diagnosis Date   Abnormal Pap smear 08/2010   HIV infection ([facility])     Social History: Social History   Socioeconomic History   Marital status: Married    Spouse name: Not on file   Number of children: Not on file   Years of education: Not on file   Highest education level: Not on file  Occupational History   Not on file  Tobacco Use   Smoking status: Every Day    Packs/day: 0.50    Types: Cigarettes   Smokeless tobacco: Never   Tobacco comments:    cutting back  Substance and Sexual Activity   Alcohol use: Yes    Alcohol/week:  21.0 standard drinks    Types: 21 Standard drinks or equivalent per week    Comment: beer   Drug use: Yes    Types: Marijuana   Sexual activity: Not Currently    Comment: declined condoms  Other Topics Concern   Not on file  Social History Narrative   Not on file   Social Determinants of Health   Financial Resource Strain: Not on file  Food Insecurity: Not on file  Transportation Needs: Not on file  Physical Activity: Not on file  Stress: Not on file  Social Connections: Not on file    Labs: Lab Results  Component Value Date   HIV1RNAQUANT 59,500 (H) 11/29/2020   HIV1RNAQUANT 91,900 (H) 08/10/2020   HIV1RNAQUANT 22,900 (H) 03/12/2020   CD4TABS 169 (L) 03/12/2020   CD4TABS 180 (L) 08/10/2019   CD4TABS 204 (L) 04/18/2019    RPR and STI Lab Results  Component Value Date   LABRPR REACTIVE (A) 10/28/2018   LABRPR NON-REACTIVE 02/04/2017   LABRPR NON REAC 06/16/2016   LABRPR NON REAC 04/09/2015   LABRPR NON REAC 11/21/2013   RPRTITER 1:1 (H) 10/28/2018    STI Results GC CT  12/13/2018 Negative Negative  06/16/2016 Negative Negative    Hepatitis B Lab Results  Component Value Date   HEPBSAB NEG 01/13/2013   HEPBSAG NEGATIVE 06/12/2010   Hepatitis C No results  found for: HEPCAB, HCVRNAPCRQN Hepatitis A Lab Results  Component Value Date   HAV POS (A) 01/23/2011   Lipids: Lab Results  Component Value Date   CHOL 134 02/04/2017   TRIG 127 02/04/2017   HDL 68 02/04/2017   CHOLHDL 2.0 02/04/2017   VLDL 42 (H) 06/16/2016   LDLCALC 45 02/04/2017    Current HIV Regimen: Symtuza & Tivicay BID  Assessment: Christine Dawson presents today for HIV follow-up. Last seen 11/29/20 where patient was put on Symtuza and Tivicay BID. Reports good tolerance and only missing 1-2 doses/month. HIV mutations show high level resistance to all INSTIs and resistance to several NNRTIs and NRTIs. No PI resistance and all components of Symtuza are still appropriate. Due to high level  dolutegravir resistance, will discontinue Tivicay and continue treatment with Symtuza alone to reduce pill burden. Patient was counseled on the importance of adherence and voiced understanding to change of regimen. Continue Bactrim for now given last CD4 110. Check HIV RNA, CD4, CMP, CBC, and RPR today. Will meet with financial counselor to renew ADAP/RW. Close follow-up in 1 month to assess change in regimen and labs.  Plan: -- Stop Tivicay -- Continue Symtuza daily with food -- Check HIV RNA, CD4, CMP, CBC, RPR -- Next f/u visit scheduled for 03/25/21 with Christine Dawson  [name] [name]) [name], PharmD Student  02/25/2021, 2:15 PM

## 2021-02-26 LAB — T-HELPER CELL (CD4) - (RCID CLINIC ONLY)
CD4 % Helper T Cell: 10 % — ABNORMAL LOW (ref 33–65)
CD4 T Cell Abs: 112 /uL — ABNORMAL LOW (ref 400–1790)

## 2021-02-27 LAB — COMPREHENSIVE METABOLIC PANEL
AG Ratio: 1.1 (calc) (ref 1.0–2.5)
ALT: 7 U/L (ref 6–29)
AST: 17 U/L (ref 10–35)
Albumin: 3.9 g/dL (ref 3.6–5.1)
Alkaline phosphatase (APISO): 71 U/L (ref 37–153)
BUN: 13 mg/dL (ref 7–25)
CO2: 30 mmol/L (ref 20–32)
Calcium: 9.2 mg/dL (ref 8.6–10.4)
Chloride: 107 mmol/L (ref 98–110)
Creat: 0.94 mg/dL (ref 0.50–1.03)
Globulin: 3.5 g/dL (calc) (ref 1.9–3.7)
Glucose, Bld: 86 mg/dL (ref 65–99)
Potassium: 4.6 mmol/L (ref 3.5–5.3)
Sodium: 140 mmol/L (ref 135–146)
Total Bilirubin: 0.4 mg/dL (ref 0.2–1.2)
Total Protein: 7.4 g/dL (ref 6.1–8.1)

## 2021-02-27 LAB — CBC WITH DIFFERENTIAL/PLATELET
Absolute Monocytes: 291 cells/uL (ref 200–950)
Basophils Absolute: 22 cells/uL (ref 0–200)
Basophils Relative: 0.7 %
Eosinophils Absolute: 152 cells/uL (ref 15–500)
Eosinophils Relative: 4.9 %
HCT: 37 % (ref 35.0–45.0)
Hemoglobin: 12.1 g/dL (ref 11.7–15.5)
Lymphs Abs: 1221 cells/uL (ref 850–3900)
MCH: 29.3 pg (ref 27.0–33.0)
MCHC: 32.7 g/dL (ref 32.0–36.0)
MCV: 89.6 fL (ref 80.0–100.0)
MPV: 10.4 fL (ref 7.5–12.5)
Monocytes Relative: 9.4 %
Neutro Abs: 1414 cells/uL — ABNORMAL LOW (ref 1500–7800)
Neutrophils Relative %: 45.6 %
Platelets: 188 10*3/uL (ref 140–400)
RBC: 4.13 10*6/uL (ref 3.80–5.10)
RDW: 13 % (ref 11.0–15.0)
Total Lymphocyte: 39.4 %
WBC: 3.1 10*3/uL — ABNORMAL LOW (ref 3.8–10.8)

## 2021-02-27 LAB — RPR: RPR Ser Ql: NONREACTIVE

## 2021-02-27 LAB — HIV-1 RNA QUANT-NO REFLEX-BLD
HIV 1 RNA Quant: 106000 Copies/mL — ABNORMAL HIGH
HIV-1 RNA Quant, Log: 5.03 Log cps/mL — ABNORMAL HIGH

## 2021-02-28 NOTE — Progress Notes (Signed)
*  Sigh* Christine Dawson' viral load from Monday... she told us that she had only missed one dose in the last month or so.Marland Kitchen

## 2021-02-28 NOTE — Progress Notes (Signed)
Yes they are. She was able to point to the exact ones on a chart that she was taking and sounded very confident doing so... I kinda thought it would be better honestly based on our interaction

## 2021-03-01 ENCOUNTER — Other Ambulatory Visit: Payer: Self-pay | Admitting: Infectious Diseases

## 2021-03-01 DIAGNOSIS — I10 Essential (primary) hypertension: Secondary | ICD-10-CM

## 2021-03-01 DIAGNOSIS — B20 Human immunodeficiency virus [HIV] disease: Secondary | ICD-10-CM

## 2021-03-01 NOTE — Telephone Encounter (Signed)
Advise on refill. 

## 2021-03-25 ENCOUNTER — Ambulatory Visit (INDEPENDENT_AMBULATORY_CARE_PROVIDER_SITE_OTHER): Payer: Self-pay | Admitting: Pharmacist

## 2021-03-25 ENCOUNTER — Other Ambulatory Visit: Payer: Self-pay | Admitting: Infectious Diseases

## 2021-03-25 ENCOUNTER — Other Ambulatory Visit: Payer: Self-pay

## 2021-03-25 DIAGNOSIS — B20 Human immunodeficiency virus [HIV] disease: Secondary | ICD-10-CM

## 2021-03-25 DIAGNOSIS — I10 Essential (primary) hypertension: Secondary | ICD-10-CM

## 2021-03-25 NOTE — Progress Notes (Signed)
03/25/2021  HPI: Christine Dawson is a 56 y.o. female who presents to the [facility] pharmacy clinic for HIV follow-up.  Patient Active Problem List   Diagnosis Date Noted   Lower extremity weakness 08/11/2020   Healthcare maintenance 12/13/2018   AIDS (acquired immune deficiency syndrome) ([facility]) 10/30/2018   Tobacco abuse 11/10/2011   Hypertension 11/10/2011   LGSIL of cervix of undetermined significance 01/01/2011   Human immunodeficiency virus (HIV) disease ([facility]) 09/28/2006   History of syphilis 09/28/2006   Alcohol abuse 09/28/2006   History of hepatitis B 09/28/2006    Patient's Medications  New Prescriptions   No medications on file  Previous Medications   AMLODIPINE (NORVASC) 10 MG TABLET    TAKE 1 TABLET BY MOUTH EVERY DAY   ATENOLOL (TENORMIN) 50 MG TABLET    TAKE 1 TABLET(50 MG) BY MOUTH DAILY   DARUNAVIR-COBICISTAT-EMTRICITABINE-TENOFOVIR ALAFENAMIDE (SYMTUZA) 800-150-200-10 MG TABS    Take 1 tablet by mouth daily with breakfast.   SULFAMETHOXAZOLE-TRIMETHOPRIM (BACTRIM DS) 800-160 MG TABLET    Take 1 tablet once a day  Modified Medications   No medications on file  Discontinued Medications   No medications on file    Allergies: No Known Allergies  Past Medical History: Past Medical History:  Diagnosis Date   Abnormal Pap smear 08/2010   HIV infection ([facility])     Social History: Social History   Socioeconomic History   Marital status: Married    Spouse name: Not on file   Number of children: Not on file   Years of education: Not on file   Highest education level: Not on file  Occupational History   Not on file  Tobacco Use   Smoking status: Every Day    Packs/day: 0.50    Types: Cigarettes   Smokeless tobacco: Never   Tobacco comments:    cutting back  Substance and Sexual Activity   Alcohol use: Yes    Alcohol/week: 21.0 standard drinks    Types: 21 Standard drinks or equivalent per week    Comment: beer   Drug use: Yes    Types: Marijuana   Sexual  activity: Not Currently    Comment: declined condoms  Other Topics Concern   Not on file  Social History Narrative   Not on file   Social Determinants of Health   Financial Resource Strain: Not on file  Food Insecurity: Not on file  Transportation Needs: Not on file  Physical Activity: Not on file  Stress: Not on file  Social Connections: Not on file    Labs: Lab Results  Component Value Date   HIV1RNAQUANT 106,000 (H) 02/25/2021   HIV1RNAQUANT 59,500 (H) 11/29/2020   HIV1RNAQUANT 91,900 (H) 08/10/2020   CD4TABS 112 (L) 02/25/2021   CD4TABS 169 (L) 03/12/2020   CD4TABS 180 (L) 08/10/2019    RPR and STI Lab Results  Component Value Date   LABRPR NON-REACTIVE 02/25/2021   LABRPR REACTIVE (A) 10/28/2018   LABRPR NON-REACTIVE 02/04/2017   LABRPR NON REAC 06/16/2016   LABRPR NON REAC 04/09/2015   RPRTITER 1:1 (H) 10/28/2018    STI Results GC CT  12/13/2018 Negative Negative  06/16/2016 Negative Negative    Hepatitis B Lab Results  Component Value Date   HEPBSAB NEG 01/13/2013   HEPBSAG NEGATIVE 06/12/2010   Hepatitis C No results found for: HEPCAB, HCVRNAPCRQN Hepatitis A Lab Results  Component Value Date   HAV POS (A) 01/23/2011   Lipids: Lab Results  Component Value Date  CHOL 134 02/04/2017   TRIG 127 02/04/2017   HDL 68 02/04/2017   CHOLHDL 2.0 02/04/2017   VLDL 42 (H) 06/16/2016   LDLCALC 45 02/04/2017    Current HIV Regimen: Symtuza  Assessment: Melondy is here today for her 4-week follow up for HIV medication noncompliance. She reported only missing one dose at last visit but viral load resulted at 106,000. We discussed her numbers today and she tells me that she usually misses about 1-2 doses per week. She had a pill box key chain with her today. She emptied it out for me and thought her HIV medication was in there, but the only pills in there were 3 amlodipine tablets and a 1/2 of a bactrim tablet. I showed her what Darrell Jewel looks like and she  said "oh yea, that is at home". She does take it with food.   We discussed barriers and issues with missing her Symtuza that many times per week. She states that she just forgets. I asked if it was because of the need to eat with it and she said no, that she eats fine. I offered her a pill box but Mitch already gave her one. I asked her to put her box somewhere that she sees every day - beside the fridge/stove, beside her toothbrush, etc. She will put it in the living room beside her chair. She has had a lot of grief lately, which I am sure is contributing to her missed doses.  She genuinely wants to do better. I showed her her viral load numbers from back in 2017- 2018 when she was undetectable and told her that I know she can do it again. She seems motivated and "promised" me that she was going to do better. She is working with Marthann Schiller now, which I think will help tremendously.  He will check on her every Monday for a little bit to make sure her pill box is filled out correctly and she is taking it like she needs to.  I will check a genotype again today to make sure everything remains the same. 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 I will have her see Judeth Cornfield again in 4-5 weeks. Marthann Schiller will let me know if she needs to be seen sooner.   Plan: - Continue Symtuza PO once daily with food - HIV RNA with reflex to genotype today - F/up with Judeth Cornfield on 4/3 at 9am  Manford Sprong L. Marian Grandt, PharmD, BCIDP, AAHIVP, CPP Clinical Pharmacist Practitioner Infectious Diseases Clinical Pharmacist [facility] 03/25/2021, 9:23 AM

## 2021-03-25 NOTE — Telephone Encounter (Signed)
Okay to refill? No recent BP on file, thanks!

## 2021-04-03 LAB — HIV-1 INTEGRASE GENOTYPE

## 2021-04-03 LAB — HIV RNA, RTPCR W/R GT (RTI, PI,INT)
HIV 1 RNA Quant: 8790 copies/mL — ABNORMAL HIGH
HIV-1 RNA Quant, Log: 3.94 Log copies/mL — ABNORMAL HIGH

## 2021-04-03 LAB — HIV-1 GENOTYPE: HIV-1 Genotype: DETECTED — AB

## 2021-05-06 ENCOUNTER — Ambulatory Visit: Payer: Self-pay | Admitting: Infectious Diseases

## 2021-05-13 ENCOUNTER — Other Ambulatory Visit: Payer: Self-pay | Admitting: Infectious Diseases

## 2021-05-13 ENCOUNTER — Ambulatory Visit: Payer: Self-pay | Admitting: Infectious Diseases

## 2021-05-13 DIAGNOSIS — I10 Essential (primary) hypertension: Secondary | ICD-10-CM

## 2021-05-13 NOTE — Telephone Encounter (Signed)
Appt 4/10

## 2021-05-20 ENCOUNTER — Other Ambulatory Visit: Payer: Self-pay

## 2021-05-20 ENCOUNTER — Ambulatory Visit (INDEPENDENT_AMBULATORY_CARE_PROVIDER_SITE_OTHER): Payer: Self-pay | Admitting: Infectious Diseases

## 2021-05-20 ENCOUNTER — Encounter: Payer: Self-pay | Admitting: Infectious Diseases

## 2021-05-20 VITALS — BP 136/79 | HR 62 | Temp 98.2°F | Wt 130.0 lb

## 2021-05-20 DIAGNOSIS — B20 Human immunodeficiency virus [HIV] disease: Secondary | ICD-10-CM

## 2021-05-20 DIAGNOSIS — M79671 Pain in right foot: Secondary | ICD-10-CM | POA: Insufficient documentation

## 2021-05-20 DIAGNOSIS — I10 Essential (primary) hypertension: Secondary | ICD-10-CM

## 2021-05-20 MED ORDER — AMLODIPINE BESYLATE 10 MG PO TABS: 10.0000 mg | ORAL_TABLET | Freq: Every day | ORAL | 11 refills | Status: DC

## 2021-05-20 MED ORDER — ATENOLOL 50 MG PO TABS: 50.0000 mg | ORAL_TABLET | Freq: Every day | ORAL | 11 refills | Status: DC

## 2021-05-20 NOTE — Assessment & Plan Note (Signed)
Better controlled on Amlodipine and Atenolol - will refill today.  ? ?Her CVD risk score (not accounting for HIV) is 16.6% - I hesitate to add more pill burden to her currently. She is not diabetic and doing well on current regimen. Will at next OV in July screen for diabetes and repeat fasting lipids.  ?Hopes to get her on pitavastatin once daily for primary CVD prevention in the next 6 months.  ?

## 2021-05-20 NOTE — Progress Notes (Signed)
? ?Name: Christine Dawson  ?DOB: 07/04/1965 ?MRN: [id number] ?PCP: Patient, No Pcp Per (Inactive)  ? ? ? ?Brief Narrative:  ?Christine Dawson is a 56 y.o. female with HIV, +AIDS, Dx 09-2006. ?CD4 nadir 58 (10-2018); VL 258,000 Jan 2019  ? ?HIV Risk: sexual   ?History of OIs: unknown ?Intake Labs 2014 ?Hep B sAg (- 2012), sAb (-), cAb (); Hep A (+2012), Hep C (- 2012) ?Quantiferon () ?HLA B*5701 (- 2013) ?G6PD: () ? ? ?Previous Regimens: ?Genvoya >> failed with resistance  ?Odefsey + Prezcobix  ?01/2020 - Symtuza + BID Tivicay  ? ? 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 See Cumulative data report in Overview of Problem.  ? ? ?Subjective:  ? ?CC: ?HIV follow up care - says she has been doing a great job taking her medications.  ?New onset right foot pain.  ? ? ?HPI: ?Christine Dawson is here today for follow up. Working with Mitch - she is grateful to have the opportunity to work with him again and feels he really helps stay on her. Has been doing very well with taking her medication. Has been using a pill tray and key chain holder to help. Feels like her body was better than what it was. Still stressed with situations at home - we spent a little time discussing this today.  ? ?Rt foot pain on the top of her foot - not sure if it is gout or arthritis but it can be very painful. Happens throughout the day. Feels that her foot/ankle is swollen. Never hot/warm but tender. Resting helps it a little. Describes it to be all over the top of her right foot, side of the toes and heel. She does get a chance to sit down at work to help with the pain, fortunately.  ? ? ?Review of Systems  ?Constitutional:  Negative for appetite change, chills, fatigue, fever and unexpected weight change.  ?HENT:  Negative for mouth sores, sore throat and trouble swallowing.   ?Eyes:  Negative for pain and visual disturbance.  ?Respiratory:  Negative for cough and shortness of breath.   ?Cardiovascular:  Negative for  chest pain.  ?Gastrointestinal:  Negative for abdominal pain, diarrhea and nausea.  ?Genitourinary:  Negative for dysuria, menstrual problem and pelvic pain.  ?Musculoskeletal:  Negative for back pain, gait problem and neck pain.  ?Skin:  Negative for color change and rash.  ?Allergic/Immunologic: Positive for immunocompromised state.  ?Neurological:  Negative for weakness, light-headedness, numbness and headaches.  ?Hematological:  Negative for adenopathy.  ?Psychiatric/Behavioral:  Negative for dysphoric mood. The patient is not nervous/anxious.   ? ? ?Past Medical History:  ?Diagnosis Date  ? Abnormal Pap smear 08/2010  ? HIV infection (HCC)   ? ? ?Outpatient Medications Prior to Visit  ?Medication Sig Dispense Refill  ? Darunavir-Cobicistat-Emtricitabine-Tenofovir Alafenamide (SYMTUZA) 800-150-200-10 MG TABS Take 1 tablet by mouth daily with breakfast. 30 tablet 5  ? sulfamethoxazole-trimethoprim (BACTRIM DS) 800-160 MG tablet Take 1 tablet once a day 30 tablet 11  ? amLODipine (NORVASC) 10 MG tablet TAKE 1 TABLET BY MOUTH EVERY DAY 30 tablet 1  ? atenolol (TENORMIN) 50 MG tablet TAKE 1 TABLET(50 MG) BY MOUTH DAILY 30 tablet 1  ? ?No facility-administered medications prior to visit.  ?  ? ?No Known Allergies ? ?Social History  ? ?Tobacco Use  ? Smoking status: Every Day  ?  Packs/day: 0.50  ?  Types: Cigarettes  ? Smokeless tobacco: Never  ?   Tobacco comments:  ?  cutting back  ?Substance Use Topics  ? Alcohol use: Yes  ?  Alcohol/week: 21.0 standard drinks  ?  Types: 21 Standard drinks or equivalent per week  ?  Comment: beer  ? Drug use: Yes  ?  Types: Marijuana  ? ? ?Social History  ? ?Substance and Sexual Activity  ?Sexual Activity Not Currently  ? Comment: declined condoms  ? ? ? ?Objective:  ? ?Vitals:  ? 05/20/21 1419  ?BP: 136/79  ?Pulse: 62  ?Temp: 98.2 ?F (36.8 ?C)  ?TempSrc: Oral  ?Weight: 130 lb (59 kg)  ? ?Body mass index is 23.03 kg/m?. ? ? ?Physical Exam ?HENT:  ?   Mouth/Throat:  ?   Mouth: No oral  lesions.  ?   Dentition: No dental abscesses.  ?Cardiovascular:  ?   Rate and Rhythm: Normal rate and regular rhythm.  ?   Heart sounds: Normal heart sounds.  ?Pulmonary:  ?   Effort: Pulmonary effort is normal.  ?   Breath sounds: Normal breath sounds.  ?Abdominal:  ?   General: There is no distension.  ?   Palpations: Abdomen is soft.  ?   Tenderness: There is no abdominal tenderness.  ?Musculoskeletal:     ?   General: Normal range of motion.  ?   Right foot: Normal range of motion. Swelling and tenderness present. No deformity. Normal pulse.  ?   Comments: No warmth.   ?Lymphadenopathy:  ?   Cervical: No cervical adenopathy.  ?Skin: ?   General: Skin is warm and dry.  ?   Findings: No rash.  ?Neurological:  ?   Mental Status: She is alert and oriented to person, place, and time.  ?   Sensory: No sensory deficit.  ?   Motor: No weakness.  ?   Coordination: Coordination normal.  ?   Gait: Gait normal.  ?   Deep Tendon Reflexes: Reflexes normal.  ?Psychiatric:     ?   Mood and Affect: Mood normal.     ?   Behavior: Behavior normal.     ?   Judgment: Judgment normal.  ? ? ?Lab Results ?Lab Results  ?Component Value Date  ? WBC 3.1 (L) 02/25/2021  ? HGB 12.1 02/25/2021  ? HCT 37.0 02/25/2021  ? MCV 89.6 02/25/2021  ? PLT 188 02/25/2021  ?  ?Lab Results  ?Component Value Date  ? CREATININE 0.94 02/25/2021  ? BUN 13 02/25/2021  ? NA 140 02/25/2021  ? K 4.6 02/25/2021  ? CL 107 02/25/2021  ? CO2 30 02/25/2021  ?  ?Lab Results  ?Component Value Date  ? ALT 7 02/25/2021  ? AST 17 02/25/2021  ? ALKPHOS 58 06/16/2016  ? BILITOT 0.4 02/25/2021  ?  ?Lab Results  ?Component Value Date  ? CHOL 134 02/04/2017  ? HDL 68 02/04/2017  ? LDLCALC 45 02/04/2017  ? TRIG 127 02/04/2017  ? CHOLHDL 2.0 02/04/2017  ? ?HIV 1 RNA Quant  ?Date Value  ?03/25/2021 8,790 copies/mL (H)  ?02/25/2021 106,000 Copies/mL (H)  ?11/29/2020 59,500 copies/mL (H)  ? ?CD4 T Cell Abs (/uL)  ?Date Value  ?02/25/2021 112 (L)  ?03/12/2020 169 (L)  ?08/10/2019  180 (L)  ? ? ? ?Assessment & Plan:  ? ?Patient Active Problem List  ? Diagnosis Date Noted  ? Acute foot pain, right 05/20/2021  ? Healthcare maintenance 12/13/2018  ? AIDS (acquired immune deficiency syndrome) ([place]) 10/30/2018  ? Tobacco abuse 11/10/2011  ?  Hypertension 11/10/2011  ? LGSIL of cervix of undetermined significance 01/01/2011  ? Human immunodeficiency virus (HIV) disease (HCC) 09/28/2006  ? History of syphilis 09/28/2006  ? Alcohol abuse 09/28/2006  ? History of hepatitis B 09/28/2006  ? ? ?Problem List Items Addressed This Visit   ? ?  ? Unprioritized  ? Hypertension (Chronic)  ?  Better controlled on Amlodipine and Atenolol - will refill today.  ? ?Her CVD risk score (not accounting for HIV) is 16.6% - I hesitate to add more pill burden to her currently. She is not diabetic and doing well on current regimen. Will at next OV in July screen for diabetes and repeat fasting lipids.  ?Hopes to get her on pitavastatin once daily for primary CVD prevention in the next 6 months.  ? ?  ?  ? Relevant Medications  ? amLODipine (NORVASC) 10 MG tablet  ? atenolol (TENORMIN) 50 MG tablet  ? Acute foot pain, right  ?  No injury to explain.  Seems pretty diffuse to involve her ankle/top and bottom of foot. Not warm/hot, slightly swollen but not significantly so. Not sure it is c/w gout given it is pretty cool to the touch and non-inflamed appearing. ?Arthritis vs tendinitis vs plantar fasciitis - Will treat with topical diclofenac QID and ice at home to treat. Can use PRN tylenol as well but she likes the idea of topical treatment. Offered note for work but she doesn't think that's needed for now. Will let me know.  ? ?  ?  ? AIDS (acquired immune deficiency syndrome) (HCC) - Primary  ?  Repeat CD4 today - she feels her labs are going to look very good today and it sounds like her approach to medication management has improved greatly. Will repeat CD4 and VL today.  ?She will continue the symtuza once daily and  bactrim once daily.  ?RTC in 3m for Adap re-enrollment and same day labs.  ? ?  ?  ? Relevant Orders  ? HIV-1 RNA quant-no reflex-bld  ? T-helper cells (CD4) count (not at [facility])  ? ?Other Visit Diagnoses

## 2021-05-20 NOTE — Patient Instructions (Addendum)
Diclofenac Sodium gel (also known as Diclofenac). This is a gel that helps with arthritis.  ?You can use it up to 4 times a day.  ?Resting and using ice for 15 minutes at a time may help as well - especially at night before bed.  ? ?Continue the symtuza and bactrim once a day with food like you have been.  ? ?Will plan to see you back in July on a Monday - we can help with ADAP re-application the same day for you.  ? ? ? ?  ?

## 2021-05-20 NOTE — Assessment & Plan Note (Signed)
No injury to explain.  Seems pretty diffuse to involve her ankle/top and bottom of foot. Not warm/hot, slightly swollen but not significantly so. Not sure it is c/w gout given it is pretty cool to the touch and non-inflamed appearing. ?Arthritis vs tendinitis vs plantar fasciitis - Will treat with topical diclofenac QID and ice at home to treat. Can use PRN tylenol as well but she likes the idea of topical treatment. Offered note for work but she doesn't think that's needed for now. Will let me know.  ?

## 2021-05-20 NOTE — Assessment & Plan Note (Signed)
Repeat CD4 today - she feels her labs are going to look very good today and it sounds like her approach to medication management has improved greatly. Will repeat CD4 and VL today.  ?She will continue the symtuza once daily and bactrim once daily.  ?RTC in 48m for Adap re-enrollment and same day labs.  ?

## 2021-05-22 LAB — HELPER T-LYMPH-CD4 (ARMC ONLY)
% CD 4 Pos. Lymph.: 14.4 % — ABNORMAL LOW (ref 30.8–58.5)
Absolute CD 4 Helper: 259 /uL — ABNORMAL LOW (ref 359–1519)
Basophils Absolute: 0.1 10*3/uL (ref 0.0–0.2)
Basos: 1 %
EOS (ABSOLUTE): 0.2 10*3/uL (ref 0.0–0.4)
Eos: 3 %
Hematocrit: 41.1 % (ref 34.0–46.6)
Hemoglobin: 12.7 g/dL (ref 11.1–15.9)
Immature Grans (Abs): 0 10*3/uL (ref 0.0–0.1)
Immature Granulocytes: 0 %
Lymphocytes Absolute: 1.8 10*3/uL (ref 0.7–3.1)
Lymphs: 40 %
MCH: 28.7 pg (ref 26.6–33.0)
MCHC: 30.9 g/dL — ABNORMAL LOW (ref 31.5–35.7)
MCV: 93 fL (ref 79–97)
Monocytes Absolute: 0.4 10*3/uL (ref 0.1–0.9)
Monocytes: 10 %
Neutrophils Absolute: 2.1 10*3/uL (ref 1.4–7.0)
Neutrophils: 46 %
Platelets: 251 10*3/uL (ref 150–450)
RBC: 4.43 x10E6/uL (ref 3.77–5.28)
RDW: 14 % (ref 11.7–15.4)
WBC: 4.5 10*3/uL (ref 3.4–10.8)

## 2021-05-22 LAB — T-HELPER CELLS (CD4) COUNT (NOT AT ARMC)

## 2021-05-23 LAB — HIV-1 RNA QUANT-NO REFLEX-BLD
HIV 1 RNA Quant: 165 copies/mL — ABNORMAL HIGH
HIV-1 RNA Quant, Log: 2.22 Log copies/mL — ABNORMAL HIGH

## 2021-05-27 ENCOUNTER — Telehealth: Payer: Self-pay

## 2021-05-27 NOTE — Telephone Encounter (Signed)
Attempted to call patient with lab results. Left voicemail requesting call back for results. ?Leatrice Jewels, RMA ? ?

## 2021-05-27 NOTE — Telephone Encounter (Signed)
-----   Message from Blanchard Kelch, NP sent at 05/27/2021  8:10 AM EDT ----- ?Please call Christine Dawson to let her know her labs are looking SO MUCH BETTER! ?Her viral load is nice and low now at only 165 copies. This will continue to get down to < 50 with her good work and effort.  ?No changes to her medications - keep up all the great effort! ?

## 2021-05-28 NOTE — Telephone Encounter (Signed)
Spoke with patient, relayed that her viral load has decreased to 165 copies and encouraged her to keep up the good work with taking her medications daily as prescribed. Patient verbalized understanding and has no further questions.  ? ?Sandie Ano, RN ? ?

## 2021-08-07 ENCOUNTER — Ambulatory Visit: Payer: Self-pay | Admitting: Infectious Diseases

## 2021-08-19 ENCOUNTER — Ambulatory Visit: Payer: Self-pay | Admitting: Family

## 2021-08-26 ENCOUNTER — Ambulatory Visit (INDEPENDENT_AMBULATORY_CARE_PROVIDER_SITE_OTHER): Payer: Self-pay | Admitting: Infectious Diseases

## 2021-08-26 ENCOUNTER — Other Ambulatory Visit: Payer: Self-pay

## 2021-08-26 ENCOUNTER — Encounter: Payer: Self-pay | Admitting: Infectious Diseases

## 2021-08-26 VITALS — BP 128/72 | HR 67 | Temp 97.2°F | Wt 124.0 lb

## 2021-08-26 DIAGNOSIS — I10 Essential (primary) hypertension: Secondary | ICD-10-CM

## 2021-08-26 DIAGNOSIS — Z131 Encounter for screening for diabetes mellitus: Secondary | ICD-10-CM

## 2021-08-26 DIAGNOSIS — B20 Human immunodeficiency virus [HIV] disease: Secondary | ICD-10-CM

## 2021-08-26 MED ORDER — PITAVASTATIN CALCIUM 4 MG PO TABS: 1.0000 | ORAL_TABLET | Freq: Every day | ORAL | 11 refills | Status: DC

## 2021-08-26 NOTE — Assessment & Plan Note (Addendum)
She has had a nice response on Symtuza once daily - simpler regimen is best. Her last TCell count was > 250 21m ago. Stop Bactrim and watch for repeat CD4 count today. Repeat RNA level as well.  Very proud of her efforts for adherence.   With new research coming out by way of the REPRIEVE study regarding CVD risk reduction for PLWH, discussed that over the 8 year trial initiation of statin therapy was shown to reduce CV disease by 35% independent of other risk factors.  We discussed the patient's 10-year CVD Risk Score to be at least moderately elevated, and would benefit from intervention given HIV+ and > 58 yo with other co-morbidities   Pitavastatin 4 mg sent in as there is no significant drug interaction with her symtuza.   Return in about 3 months (around 11/26/2021).

## 2021-08-26 NOTE — Progress Notes (Signed)
Name: Christine Dawson  DOB: [birth year] MRN: [id number] PCP: Patient, No Pcp Per     Brief Narrative:  Christine Dawson is a 56 y.o. female with HIV, +AIDS, Dx 09-2006. 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 See Cumulative data report in Overview of Problem.    Subjective:   Chief Complaint  Patient presents with   Follow-up  Has been doing way better on her medication adherence lately. Feeling well.    HPI: Christine Dawson is here today for follow up care. She is excited because she has been doing really great on her medication adherence and feels better. Her blood pressure is perfect today. No side effects from the symtuza, or BP medications. Some leg swelling at the end of the day occasionally from working on feet a lot. 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    Review of Systems  Constitutional:  Negative for appetite change, chills, fatigue, fever and unexpected weight change.  HENT:  Negative for mouth sores, sore throat and trouble swallowing.   Eyes:  Negative for pain and visual disturbance.  Respiratory:  Negative for cough and shortness of breath.   Cardiovascular:  Negative for chest pain.  Gastrointestinal:  Negative for abdominal pain, diarrhea and nausea.  Genitourinary:  Negative for dysuria, menstrual problem and pelvic pain.  Musculoskeletal:  Negative for back pain and neck pain.  Skin:  Negative for color change and rash.  Neurological:  Negative for weakness, numbness and headaches.  Hematological:  Negative for adenopathy.   Psychiatric/Behavioral:  Negative for dysphoric mood. The patient is not nervous/anxious.      Past Medical History:  Diagnosis Date   Abnormal Pap smear 08/2010   HIV infection [facility])     Outpatient Medications Prior to Visit  Medication Sig Dispense Refill   amLODipine (NORVASC) 10 MG tablet Take 1 tablet (10 mg total) by mouth daily. 30 tablet 11   atenolol (TENORMIN) 50 MG tablet Take 1 tablet (50 mg total) by mouth daily. 30 tablet 11   Darunavir-Cobicistat-Emtricitabine-Tenofovir Alafenamide (SYMTUZA) 800-150-200-10 MG TABS Take 1 tablet by mouth daily with breakfast. 30 tablet 5   sulfamethoxazole-trimethoprim (BACTRIM DS) 800-160 MG tablet Take 1 tablet once a day 30 tablet 11   No facility-administered medications prior to visit.     No Known Allergies  Social History   Tobacco Use   Smoking status: Every Day    Packs/day: 0.50    Types: Cigarettes   Smokeless tobacco: Never   Tobacco comments:    cutting back  Substance Use Topics   Alcohol use: Yes    Alcohol/week: 21.0 standard drinks of alcohol    Types: 21 Standard drinks or equivalent per week    Comment: beer   Drug use: Yes    Types: Marijuana    Social History   Substance and Sexual Activity  Sexual Activity Not Currently   Comment: declined condoms     Objective:   Vitals:   08/26/21 1359  BP: 128/72  Pulse: 67  Temp: (!) 97.2 F (36.2 C)  TempSrc: Temporal  Weight: 124 lb (56.2 kg)   Body mass index is 21.97 kg/m.   Physical Exam HENT:     Mouth/Throat:     Mouth: No oral lesions.     Dentition: No dental abscesses.  Cardiovascular:     Rate and Rhythm: Normal rate and regular rhythm.     Heart sounds: Normal heart sounds.  Pulmonary:     Effort: Pulmonary effort is normal.     Breath sounds: Normal breath sounds.  Abdominal:     General: There is no distension.     Palpations: Abdomen is soft.     Tenderness: There is no abdominal tenderness.  Musculoskeletal:         General: No tenderness. Normal range of motion.  Lymphadenopathy:     Cervical: No cervical adenopathy.  Skin:    General: Skin is warm and dry.     Findings: No rash.  Neurological:     Mental Status: She is alert and oriented to person, place, and time.  Psychiatric:        Judgment: Judgment normal.     Lab Results Lab Results  Component Value Date   WBC 4.5 05/20/2021   HGB 12.7 05/20/2021   HCT 41.1 05/20/2021   MCV 93 05/20/2021   PLT 251 05/20/2021    Lab Results  Component Value Date   CREATININE 0.94 02/25/2021   BUN 13 02/25/2021   NA 140 02/25/2021   K 4.6 02/25/2021   CL 107 02/25/2021   CO2 30 02/25/2021    Lab Results  Component Value Date   ALT 7 02/25/2021   AST 17 02/25/2021   ALKPHOS 58 06/16/2016   BILITOT 0.4 02/25/2021    Lab Results  Component Value Date   CHOL 134 02/04/2017   HDL 68 02/04/2017   LDLCALC 45 02/04/2017   TRIG 127 02/04/2017   CHOLHDL 2.0 02/04/2017   HIV 1 RNA Quant  Date Value  05/20/2021 165 copies/mL (H)  03/25/2021 8,790 copies/mL (H)  02/25/2021 106,000 Copies/mL (H)   CD4 T Cell Abs (/uL)  Date Value  05/20/2021 SEE SEPARATE REPORT  02/25/2021 112 (L)  03/12/2020 169 (L)     Assessment & Plan:   Patient Active Problem List   Diagnosis Date Noted   Healthcare maintenance 12/13/2018   Tobacco abuse 11/10/2011   Hypertension 11/10/2011   LGSIL of cervix of undetermined significance 01/01/2011   Human immunodeficiency virus (HIV) disease ([place]) 09/28/2006   History of syphilis 09/28/2006   Alcohol abuse 09/28/2006   History of hepatitis B 09/28/2006    Problem List Items Addressed This Visit       Unprioritized   Human immunodeficiency virus (HIV) disease ([place]) (Chronic)    She has had a nice response on Symtuza once daily - simpler regimen is best. Her last TCell count was > 250 65m ago. Stop Bactrim and watch for repeat CD4 count today. Repeat RNA level as well.  Very proud of her efforts for  adherence.   With new research coming out by way of the REPRIEVE study regarding CVD risk reduction for PLWH, discussed that over the 8 year trial initiation of statin therapy was shown to reduce CV disease by 35% independent of other risk factors.  We discussed the patient's 10-year CVD Risk Score to be at least moderately elevated, and would benefit from intervention given HIV+ and > 38 yo with other co-morbidities  Pitavastatin 4 mg sent in as there is no significant drug interaction with her symtuza.   Return in about 3 months (around 11/26/2021).        Hypertension (Chronic)    BP Readings from Last 3 Encounters:  08/26/21 128/72  05/20/21 136/79  11/29/20 (!) 151/80  Blood pressure looks perfect on current regimen - no changes.  Diabetes screening today.       Relevant Medications   Pitavastatin Calcium 4 MG TABS   RESOLVED: AIDS (acquired immune deficiency syndrome) ([place]) - Primary   Relevant Orders   HIV-1 RNA quant-no reflex-bld   T-helper cells (CD4) count   Other Visit Diagnoses     Diabetes mellitus screening       Relevant Orders   Hemoglobin A1c     Janene Madeira, MSN, NP-C [facility] [place] Pager: [phone] Office: [phone]  08/26/21  2:16 PM

## 2021-08-26 NOTE — Assessment & Plan Note (Signed)
BP Readings from Last 3 Encounters:  08/26/21 128/72  05/20/21 136/79  11/29/20 (!) 151/80   Blood pressure looks perfect on current regimen - no changes.  Diabetes screening today.

## 2021-08-26 NOTE — Patient Instructions (Signed)
So nice to see you!   STOP the Bactrim (the antibiotic that you were taking before) - your immune system has improved to where you don't need this anymore :)   CONTINUE the Symtuza once a day with food  CONTINUE the Atenolol and Amlodipine once a day - your blood pressure is PERFECT!!  START Pitavastatin - this is the cholesterol medication we talked about. Will send it to be mailed to specialty pharmacy   Would like to see you in 3 months to check in again and repeat your liver function tests on the new medication.   Christine Dawson at your interview!!

## 2021-08-27 LAB — T-HELPER CELLS (CD4) COUNT (NOT AT ARMC)
CD4 % Helper T Cell: 15 % — ABNORMAL LOW (ref 33–65)
CD4 T Cell Abs: 232 /uL — ABNORMAL LOW (ref 400–1790)

## 2021-08-29 ENCOUNTER — Telehealth: Payer: Self-pay

## 2021-08-29 LAB — HEMOGLOBIN A1C
Hgb A1c MFr Bld: 5.3 % of total Hgb (ref <5.7)
Mean Plasma Glucose: 105 mg/dL
eAG (mmol/L): 5.8 mmol/L

## 2021-08-29 LAB — HIV-1 RNA QUANT-NO REFLEX-BLD
HIV 1 RNA Quant: <20 Copies/mL — ABNORMAL HIGH
HIV-1 RNA Quant, Log: <1.3 Log cps/mL — ABNORMAL HIGH

## 2021-08-29 NOTE — Telephone Encounter (Signed)
Spoke with Kaci, relayed that her viral load is undetectable and CD4 count is slowly improving up to 232.   She is asking about having a TB skin test done for job application. Relayed that we do not have that test in the office and recommended she check with [facility].   Sandie Ano, RN

## 2021-08-29 NOTE — Telephone Encounter (Signed)
-----   Message from Blanchard Kelch, NP sent at 08/29/2021 12:33 PM EDT ----- Please call Christine Dawson to let her know that her viral load is undetectable and her immune system continues to improve slowly - her TCell count is up to 232.   I hope her interview went well and look forward to seeing her at her next visit.

## 2021-09-02 ENCOUNTER — Other Ambulatory Visit: Payer: Self-pay

## 2021-09-02 ENCOUNTER — Telehealth: Payer: Self-pay

## 2021-09-02 ENCOUNTER — Other Ambulatory Visit: Payer: Self-pay | Admitting: Infectious Diseases

## 2021-09-02 DIAGNOSIS — Z111 Encounter for screening for respiratory tuberculosis: Secondary | ICD-10-CM

## 2021-09-02 NOTE — Telephone Encounter (Signed)
Patient called office to confirm if we received health form that was emailed to office. Form received and printed for provider to review and sign. Patient would like copy emailed to her once completed. Would like for [place] to fax completed form to [facility] system. F: [phone] Patient email: [email or web address] Juanita Laster, RMA

## 2021-09-02 NOTE — Telephone Encounter (Addendum)
Patient schedule for nurse visit on 8/07

## 2021-09-04 LAB — QUANTIFERON-TB GOLD PLUS
Mitogen-NIL: 8.35 IU/mL
NIL: 0.03 IU/mL
QuantiFERON-TB Gold Plus: NEGATIVE
TB1-NIL: 0 IU/mL
TB2-NIL: 0 IU/mL

## 2021-09-09 ENCOUNTER — Ambulatory Visit (INDEPENDENT_AMBULATORY_CARE_PROVIDER_SITE_OTHER): Payer: Self-pay

## 2021-09-09 ENCOUNTER — Other Ambulatory Visit: Payer: Self-pay

## 2021-09-09 DIAGNOSIS — B20 Human immunodeficiency virus [HIV] disease: Secondary | ICD-10-CM

## 2021-09-09 DIAGNOSIS — Z23 Encounter for immunization: Secondary | ICD-10-CM

## 2021-09-09 NOTE — Telephone Encounter (Signed)
Patient received tdap vaccine at nurse visit today (8/7).   Provided patient with copy of forms and faxed copy to [facility] per patient request.   Sandie Ano, RN

## 2021-10-14 ENCOUNTER — Other Ambulatory Visit: Payer: Self-pay

## 2021-10-14 DIAGNOSIS — B20 Human immunodeficiency virus [HIV] disease: Secondary | ICD-10-CM

## 2021-10-14 MED ORDER — SYMTUZA 800-150-200-10 MG PO TABS: 1.0000 | ORAL_TABLET | Freq: Every day | ORAL | 5 refills | Status: DC

## 2021-11-25 ENCOUNTER — Encounter: Payer: Self-pay | Admitting: Infectious Diseases

## 2021-11-25 ENCOUNTER — Ambulatory Visit: Payer: 59 | Admitting: Infectious Diseases

## 2021-11-25 ENCOUNTER — Other Ambulatory Visit: Payer: Self-pay

## 2021-11-25 ENCOUNTER — Ambulatory Visit (INDEPENDENT_AMBULATORY_CARE_PROVIDER_SITE_OTHER): Payer: 59 | Admitting: Infectious Diseases

## 2021-11-25 VITALS — BP 115/69 | HR 62 | Temp 97.9°F | Ht 62.0 in | Wt 130.0 lb

## 2021-11-25 DIAGNOSIS — I10 Essential (primary) hypertension: Secondary | ICD-10-CM | POA: Diagnosis not present

## 2021-11-25 DIAGNOSIS — B20 Human immunodeficiency virus [HIV] disease: Secondary | ICD-10-CM

## 2021-11-25 DIAGNOSIS — Z23 Encounter for immunization: Secondary | ICD-10-CM

## 2021-11-25 DIAGNOSIS — F17210 Nicotine dependence, cigarettes, uncomplicated: Secondary | ICD-10-CM

## 2021-11-25 DIAGNOSIS — Z0000 Encounter for general adult medical examination without abnormal findings: Secondary | ICD-10-CM

## 2021-11-25 NOTE — Patient Instructions (Addendum)
Will call you with your results when they are back. If your immune system continues to look stronger we can stop the antibiotic (Bactrim). Please continue your Symtuza and Norvasc and Atenolol.   It is wonderful to see you! I am so glad you are enjoying your job.   Will plan to see you back in 3-4 months to get your pap smear done and get your mammogram arranged.   Karn Pickler is going to check into your eligibility for Medicaid    [place] Medicaid Expansion Flyer      How To Apply for Medicaid?

## 2021-11-25 NOTE — Progress Notes (Addendum)
Name: Christine Dawson  DOB: [birth year] MRN: [id number] PCP: Patient, No Pcp Per     Brief Narrative:  Christine Dawson is a 55 y.o. female with HIV, +AIDS, Dx 09-2006. 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 See Cumulative data report in Overview of Problem.    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 Taking her Symtuza, bactrim and BP medications everyday without any trouble. Has done much better with adherence and proud to get the lab results today. Happy working with Marthann Schiller and appreciates his help.  Hopeful Christine Dawson may be able to qualify for Medicaid with expansion. We talked about this a little today. Needs flu shot.    Review of Systems  Constitutional:  Negative for appetite change, chills, fatigue, fever and unexpected weight change.  HENT:  Negative for mouth sores, sore throat and trouble swallowing.   Eyes:  Negative for pain and visual disturbance.  Respiratory:  Negative for cough and shortness of breath.   Cardiovascular:  Negative for chest pain.  Gastrointestinal:  Negative for abdominal pain, diarrhea and nausea.  Genitourinary:  Negative for dysuria, menstrual problem and pelvic pain.  Musculoskeletal:  Negative for back pain and neck pain.  Skin:  Negative for color change and rash.  Neurological:  Negative for weakness, numbness and headaches.  Hematological:  Negative for  adenopathy.  Psychiatric/Behavioral:  Negative for dysphoric mood. The patient is not nervous/anxious.      Past Medical History:  Diagnosis Date   Abnormal Pap smear 08/2010   HIV infection [facility])     Outpatient Medications Prior to Visit  Medication Sig Dispense Refill   amLODipine (NORVASC) 10 MG tablet Take 1 tablet (10 mg total) by mouth daily. 30 tablet 11   atenolol (TENORMIN) 50 MG tablet Take 1 tablet (50 mg total) by mouth daily. 30 tablet 11   Darunavir-Cobicistat-Emtricitabine-Tenofovir Alafenamide (SYMTUZA) 800-150-200-10 MG TABS Take 1 tablet by mouth daily with breakfast. 30 tablet 5   Pitavastatin Calcium 4 MG TABS Take 1 tablet (4 mg total) by mouth daily. 30 tablet 11   sulfamethoxazole-trimethoprim (BACTRIM DS) 800-160 MG tablet Take 1 tablet by mouth daily.     No facility-administered medications prior to visit.     No Known Allergies  Social History   Tobacco Use   Smoking status: Every Day    Packs/day: 0.50    Types: Cigarettes   Smokeless tobacco: Never   Tobacco comments:    cutting back  Substance Use Topics   Alcohol use: Yes    Alcohol/week: 21.0 standard drinks of alcohol    Types: 21 Standard drinks or equivalent per week    Comment: beer   Drug use: Yes    Types: Marijuana    Social History   Substance and Sexual Activity  Sexual Activity Not  Currently   Comment: declined condoms     Objective:   Vitals:   11/25/21 1525  BP: 115/69  Pulse: 62  Temp: 97.9 F (36.6 C)  TempSrc: Oral  Weight: 130 lb (59 kg)  Height: $Remove'5\' 2"'ObEykuY$  (1.575 m)   Body mass index is 23.78 kg/m.   Physical Exam HENT:     Mouth/Throat:     Mouth: No oral lesions.     Dentition: No dental abscesses.  Cardiovascular:     Rate and Rhythm: Normal rate and regular rhythm.     Heart sounds: Normal heart sounds.  Pulmonary:     Effort: Pulmonary effort is normal.     Breath sounds: Normal breath sounds.  Abdominal:     General: There is no distension.      Palpations: Abdomen is soft.     Tenderness: There is no abdominal tenderness.  Musculoskeletal:        General: No tenderness. Normal range of motion.  Lymphadenopathy:     Cervical: No cervical adenopathy.  Skin:    General: Skin is warm and dry.     Findings: No rash.  Neurological:     Mental Status: Christine Dawson is alert and oriented to person, place, and time.  Psychiatric:        Judgment: Judgment normal.     Lab Results Lab Results  Component Value Date   WBC 4.5 05/20/2021   HGB 12.7 05/20/2021   HCT 41.1 05/20/2021   MCV 93 05/20/2021   PLT 251 05/20/2021    Lab Results  Component Value Date   CREATININE 0.94 02/25/2021   BUN 13 02/25/2021   NA 140 02/25/2021   K 4.6 02/25/2021   CL 107 02/25/2021   CO2 30 02/25/2021    Lab Results  Component Value Date   ALT 7 02/25/2021   AST 17 02/25/2021   ALKPHOS 58 06/16/2016   BILITOT 0.4 02/25/2021    Lab Results  Component Value Date   CHOL 134 02/04/2017   HDL 68 02/04/2017   LDLCALC 45 02/04/2017   TRIG 127 02/04/2017   CHOLHDL 2.0 02/04/2017   HIV 1 RNA Quant  Date Value  11/25/2021 Not Detected Copies/mL  08/26/2021 <20 Copies/mL (H)  05/20/2021 165 copies/mL (H)   CD4 T Cell Abs (/uL)  Date Value  11/25/2021 278 (L)  08/26/2021 232 (L)  05/20/2021 SEE SEPARATE REPORT     Assessment & Plan:   Patient Active Problem List   Diagnosis Date Noted   Healthcare maintenance 12/13/2018   Tobacco abuse 11/10/2011   Hypertension 11/10/2011   LGSIL of cervix of undetermined significance 01/01/2011   Human immunodeficiency virus (HIV) disease ([place]) 09/28/2006   History of syphilis 09/28/2006   Alcohol abuse 09/28/2006   History of hepatitis B 09/28/2006    Problem List Items Addressed This Visit       Unprioritized   Human immunodeficiency virus (HIV) disease ([place]) - Primary (Chronic)    Very well controlled on once daily Symtuza with food. Christine Dawson has done very very well over the last year and has  been improving with regards to CD4 reconstitution. If CD4 > 200 still today will stop bactrim prophy.  No concerns with access or adherence to medication. They are tolerating the medication well without side effects. No drug interactions identified. Pertinent lab tests ordered today.  Reminded about ADAP re-enrollment in July / January.  Information provided for Medicaid enrollment vs Marketplace - Karn Pickler will also continue with this  discussion to help bridge.   No dental needs today.  No concern over anxious/depressed mood.  Sexual health and family planning discussed - no needs identified.  Vaccines updated today - see health maintenance section.   Return in about 4 months (around 03/28/2022).        Relevant Orders   HIV 1 RNA quant-no reflex-bld (Completed)   T-helper cells (CD4) count (Completed)   Hypertension (Chronic)    BP Readings from Last 3 Encounters:  11/25/21 115/69  08/26/21 128/72  05/20/21 136/79  Perfectly controlled now that Christine Dawson has been taking her medications regularly. No side effects. Continue atenolol 50 mg and amlodipine 10 mg daily.       Healthcare maintenance    Flu vaccine today. Will talk with her next about pap smear and colon cancer screening (may be able to coordinate with cologuard).       Other Visit Diagnoses     Need for immunization against influenza       Relevant Orders   Flu Vaccine QUAD 25mo+IM (Fluarix, Fluzone & Alfiuria Quad PF) (Completed)     Janene Madeira, MSN, NP-C [facility] [place] Pager: [phone] Office: [phone]  11/29/21  9:58 AM

## 2021-11-26 LAB — T-HELPER CELLS (CD4) COUNT (NOT AT ARMC)
CD4 % Helper T Cell: 15 % — ABNORMAL LOW (ref 33–65)
CD4 T Cell Abs: 278 /uL — ABNORMAL LOW (ref 400–1790)

## 2021-11-27 ENCOUNTER — Telehealth: Payer: Self-pay

## 2021-11-27 LAB — HIV-1 RNA QUANT-NO REFLEX-BLD
HIV 1 RNA Quant: NOT DETECTED Copies/mL
HIV-1 RNA Quant, Log: NOT DETECTED Log cps/mL

## 2021-11-27 NOTE — Progress Notes (Signed)
I am so happy with how well Christine Dawson has been doing on her medication!  Please call her and congratulate her for her hard work - her viral load is not detected at and her immune system has improved enough that she can stop the bactrim antibiotic pill. Her TCell count is 278 now.   Celebration!!

## 2021-11-27 NOTE — Telephone Encounter (Signed)
Spoke with Christine Dawson, she was very happy to hear the good news that her viral load is undetectable and her CD4 count has improved. Discussed that she does not need to continue with the bactrim antibiotic since her CD4 is over 200. Patient verbalized understanding and has no further questions.   Beryle Flock, RN

## 2021-11-27 NOTE — Telephone Encounter (Signed)
-----   Message from Carey Callas, NP sent at 11/27/2021  4:33 PM EDT ----- I am so happy with how well Christine Dawson has been doing on her medication!  Please call her and congratulate her for her hard work - her viral load is not detected at and her immune system has improved enough that she can stop the bactrim antibiotic pill. Her TCell count is 278 now.   Celebration!!

## 2021-11-29 NOTE — Assessment & Plan Note (Signed)
Flu vaccine today. Will talk with her next about pap smear and colon cancer screening (may be able to coordinate with cologuard).

## 2021-11-29 NOTE — Assessment & Plan Note (Signed)
Very well controlled on once daily Symtuza with food. She has done very very well over the last year and has been improving with regards to CD4 reconstitution. If CD4 > 200 still today will stop bactrim prophy.  No concerns with access or adherence to medication. They are tolerating the medication well without side effects. No drug interactions identified. Pertinent lab tests ordered today.  Reminded about ADAP re-enrollment in July / January.  Information provided for Medicaid enrollment vs Marketplace - Karn Pickler will also continue with this discussion to help bridge.   No dental needs today.  No concern over anxious/depressed mood.  Sexual health and family planning discussed - no needs identified.  Vaccines updated today - see health maintenance section.   Return in about 4 months (around 03/28/2022).

## 2021-11-29 NOTE — Assessment & Plan Note (Signed)
BP Readings from Last 3 Encounters:  11/25/21 115/69  08/26/21 128/72  05/20/21 136/79   Perfectly controlled now that she has been taking her medications regularly. No side effects. Continue atenolol 50 mg and amlodipine 10 mg daily.

## 2021-12-02 ENCOUNTER — Other Ambulatory Visit: Payer: Self-pay | Admitting: Infectious Diseases

## 2021-12-02 DIAGNOSIS — B20 Human immunodeficiency virus [HIV] disease: Secondary | ICD-10-CM

## 2022-01-23 ENCOUNTER — Telehealth: Payer: Self-pay

## 2022-01-23 NOTE — Telephone Encounter (Signed)
Received call from Christine Dawson, reports she has had some congestion and coughing up phlegm for about a week. Denies fever, diarrhea, muscle aches, or changes in appetite. States she took a covid test that was negative and she is wondering if she has the flu. Symptoms have not gotten worse, but have remained about the same. She has been using Halls cough drops and Vick's VapoRub.   Discussed that many respiratory/viral illnesses are circulating right now. Recommended she continue with supportive care and remain hydrated. Asked her to call if symptoms worsen or she develops fevers.   Sandie Ano, RN

## 2022-01-29 ENCOUNTER — Telehealth: Payer: Self-pay

## 2022-01-29 NOTE — Telephone Encounter (Signed)
Patient called stating that she still not feeling well congestion and sore throat. Per Judeth Cornfield try muncinex twice a day, saline rinse for nose, coricidin hbp for de congestion, and if gets worse to schedule an appointment with urgent care or mychart e-visit. Patient verbalized understanding.

## 2022-02-19 ENCOUNTER — Ambulatory Visit: Payer: Self-pay | Admitting: Infectious Diseases

## 2022-03-17 ENCOUNTER — Ambulatory Visit (INDEPENDENT_AMBULATORY_CARE_PROVIDER_SITE_OTHER): Payer: 59 | Admitting: Infectious Diseases

## 2022-03-17 ENCOUNTER — Other Ambulatory Visit: Payer: Self-pay

## 2022-03-17 ENCOUNTER — Encounter: Payer: Self-pay | Admitting: Infectious Diseases

## 2022-03-17 VITALS — BP 118/75 | HR 76 | Resp 16 | Ht 62.0 in | Wt 126.0 lb

## 2022-03-17 DIAGNOSIS — Z0000 Encounter for general adult medical examination without abnormal findings: Secondary | ICD-10-CM

## 2022-03-17 DIAGNOSIS — I10 Essential (primary) hypertension: Secondary | ICD-10-CM | POA: Diagnosis not present

## 2022-03-17 DIAGNOSIS — R87612 Low grade squamous intraepithelial lesion on cytologic smear of cervix (LGSIL): Secondary | ICD-10-CM | POA: Diagnosis not present

## 2022-03-17 DIAGNOSIS — B20 Human immunodeficiency virus [HIV] disease: Secondary | ICD-10-CM

## 2022-03-17 NOTE — Patient Instructions (Addendum)
Come back sometime in 6 months so we can re do a pap smear for you and talk about a colon cancer screening with the cologuard test.   Will also help to coordinate a mammogram at some point this year as well.

## 2022-03-17 NOTE — Progress Notes (Unsigned)
Name: Christine Dawson  DOB: 01/25/1966 MRN: [id number] PCP: Patient, No Pcp Per     Brief Narrative:  Christine Dawson is a 57 y.o. female with HIV, +AIDS, Dx 09-2006. 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 See Cumulative data report in Overview of Problem.    Subjective:   Chief Complaint  Patient presents with   Follow-up    HIV follow up care.  No concerns today.       HPI: Mother had a history of some kind of cancer but not sure which. She continues to work at [employer]. Working on a second job currently. Doing well with her statin and blood pressure mediation. No side effects.  Working with Engineer, materials, Sales executive and appreciates his help.   No changes in medications. Staying healthy. Stressors at home with family and trying to be there for them and supportive.    Review of Systems  Constitutional:  Negative for appetite change, chills, fatigue, fever and unexpected weight change.  HENT:  Negative for mouth sores, sore throat and trouble swallowing.   Eyes:  Negative for pain and visual disturbance.  Respiratory:  Negative for cough and shortness of breath.   Cardiovascular:  Negative for chest pain.  Gastrointestinal:  Negative for abdominal pain, diarrhea and nausea.  Genitourinary:  Negative for dysuria, menstrual problem and pelvic pain.  Musculoskeletal:  Negative for back pain and neck pain.  Skin:  Negative for color change and rash.  Neurological:  Negative for weakness, numbness and headaches.  Hematological:  Negative for adenopathy.   Psychiatric/Behavioral:  Negative for dysphoric mood. The patient is not nervous/anxious.      Past Medical History:  Diagnosis Date   Abnormal Pap smear 08/2010   HIV infection [facility])     Outpatient Medications Prior to Visit  Medication Sig Dispense Refill   amLODipine (NORVASC) 10 MG tablet Take 1 tablet (10 mg total) by mouth daily. 30 tablet 11   atenolol (TENORMIN) 50 MG tablet Take 1 tablet (50 mg total) by mouth daily. 30 tablet 11   Darunavir-Cobicistat-Emtricitabine-Tenofovir Alafenamide (SYMTUZA) 800-150-200-10 MG TABS Take 1 tablet by mouth daily with breakfast. 30 tablet 5   Pitavastatin Calcium 4 MG TABS Take 1 tablet (4 mg total) by mouth daily. 30 tablet 11   No facility-administered medications prior to visit.     No Known Allergies  Social History   Tobacco Use   Smoking status: Every Day    Packs/day: 0.50    Types: Cigarettes    Passive exposure: Never   Smokeless tobacco: Never   Tobacco comments:    cutting back  Vaping Use   Vaping Use: Never used  Substance Use Topics   Alcohol use: Yes    Alcohol/week: 21.0 standard drinks of alcohol    Types: 21 Standard drinks or equivalent per week    Comment: beer   Drug use: Yes    Types: Marijuana    Social History   Substance and Sexual Activity  Sexual Activity Not Currently  Comment: declined condoms     Objective:   Vitals:   03/17/22 1541  BP: 118/75  Pulse: 76  Resp: 16  SpO2: 98%  Weight: 126 lb (57.2 kg)  Height: 5' 2"$  (1.575 m)   Body mass index is 23.05 kg/m.   Physical Exam HENT:     Mouth/Throat:     Mouth: No oral lesions.     Dentition: No dental abscesses.  Cardiovascular:     Rate and Rhythm: Normal rate and regular rhythm.     Heart sounds: Normal heart sounds.  Pulmonary:     Effort: Pulmonary effort is normal.     Breath sounds: Normal breath sounds.  Abdominal:     General: There is no distension.     Palpations: Abdomen is soft.     Tenderness: There  is no abdominal tenderness.  Musculoskeletal:        General: No tenderness. Normal range of motion.  Lymphadenopathy:     Cervical: No cervical adenopathy.  Skin:    General: Skin is warm and dry.     Findings: No rash.  Neurological:     Mental Status: She is alert and oriented to person, place, and time.  Psychiatric:        Judgment: Judgment normal.     Lab Results Lab Results  Component Value Date   WBC 4.3 03/17/2022   HGB 12.1 03/17/2022   HCT 36.0 03/17/2022   MCV 93.0 03/17/2022   PLT 256 03/17/2022    Lab Results  Component Value Date   CREATININE 0.92 03/17/2022   BUN 15 03/17/2022   NA 141 03/17/2022   K 4.6 03/17/2022   CL 106 03/17/2022   CO2 29 03/17/2022    Lab Results  Component Value Date   ALT 6 03/17/2022   AST 16 03/17/2022   ALKPHOS 58 06/16/2016   BILITOT 0.4 03/17/2022    Lab Results  Component Value Date   CHOL 159 03/17/2022   HDL 67 03/17/2022   LDLCALC 73 03/17/2022   TRIG 106 03/17/2022   CHOLHDL 2.4 03/17/2022   HIV 1 RNA Quant  Date Value  11/25/2021 Not Detected Copies/mL  08/26/2021 <20 Copies/mL (H)  05/20/2021 165 copies/mL (H)   CD4 T Cell Abs (/uL)  Date Value  11/25/2021 278 (L)  08/26/2021 232 (L)  05/20/2021 SEE SEPARATE REPORT     Assessment & Plan:   Patient Active Problem List   Diagnosis Date Noted   Healthcare maintenance 12/13/2018   Tobacco abuse 11/10/2011   Hypertension 11/10/2011   LGSIL of cervix of undetermined significance 01/01/2011   Human immunodeficiency virus (HIV) disease ([place]) 09/28/2006   History of syphilis 09/28/2006   Alcohol abuse 09/28/2006   History of hepatitis B 09/28/2006    Problem List Items Addressed This Visit       Unprioritized   Human immunodeficiency virus (HIV) disease ([place]) - Primary (Chronic)    Very well controlled on once daily Symtuza with food. No concerns with access or adherence to medication. They are tolerating the medication well without side  effects. No drug interactions identified. Pertinent lab tests ordered today.  No changes to insurance coverage but she may be eligible for Medicaid soon from what I understand talking with Mitch. No dental needs today.  No concern over anxious/depressed mood currently - stressors but working though things day by day.  Sexual health and family planning discussed - no needs. Will update pap smear at next OV and help  to coordinate mammogram.  Vaccines updated today - see health maintenance section.   Return in about 6 months (around 09/15/2022).        Relevant Orders   HIV 1 RNA quant-no reflex-bld   T-helper cells (CD4) count   COMPLETE METABOLIC PANEL WITH GFR (Completed)   Lipid panel (Completed)   CBC (Completed)   LGSIL of cervix of undetermined significance    Will repeat pap smear at next OV in 22m       Hypertension (Chronic)    Well controlled on current treatment - continue amlodipine 10 mg daily, atenolol 50 mg daily.  Continue pitavastatin 4 mg daily for CVD primary prevention for PLWH.       Healthcare maintenance    Discussed setting up cologuard for colon cancer screening - she will consider.  Mammogram / pap smear at next OV in 659m Will need to give prevnar 20 booster to complete pneumococcal series.       Other Visit Diagnoses     Essential hypertension       Relevant Orders   COMPLETE METABOLIC PANEL WITH GFR (Completed)   Lipid panel (Completed)     StJanene MadeiraMSN, NP-C [facility] [place]: [phone]: [phone] [id number]/13/24  8:43 AM

## 2022-03-18 LAB — T-HELPER CELLS (CD4) COUNT (NOT AT ARMC)
CD4 % Helper T Cell: 19 % — ABNORMAL LOW (ref 33–65)
CD4 T Cell Abs: 269 /uL — ABNORMAL LOW (ref 400–1790)

## 2022-03-18 NOTE — Assessment & Plan Note (Signed)
Well controlled on current treatment - continue amlodipine 10 mg daily, atenolol 50 mg daily.  Continue pitavastatin 4 mg daily for CVD primary prevention for PLWH.

## 2022-03-18 NOTE — Assessment & Plan Note (Signed)
Will repeat pap smear at next OV in 36m

## 2022-03-18 NOTE — Assessment & Plan Note (Addendum)
Discussed setting up cologuard for colon cancer screening - she will consider.  Mammogram / pap smear at next OV in 79m  Will need to give prevnar 20 booster to complete pneumococcal series.

## 2022-03-18 NOTE — Assessment & Plan Note (Signed)
Very well controlled on once daily Symtuza with food. No concerns with access or adherence to medication. They are tolerating the medication well without side effects. No drug interactions identified. Pertinent lab tests ordered today.  No changes to insurance coverage but she may be eligible for Medicaid soon from what I understand talking with Mitch. No dental needs today.  No concern over anxious/depressed mood currently - stressors but working though things day by day.  Sexual health and family planning discussed - no needs. Will update pap smear at next OV and help to coordinate mammogram.  Vaccines updated today - see health maintenance section.   Return in about 6 months (around 09/15/2022).

## 2022-03-19 ENCOUNTER — Telehealth: Payer: Self-pay

## 2022-03-19 LAB — CBC
HCT: 36 % (ref 35.0–45.0)
Hemoglobin: 12.1 g/dL (ref 11.7–15.5)
MCH: 31.3 pg (ref 27.0–33.0)
MCHC: 33.6 g/dL (ref 32.0–36.0)
MCV: 93 fL (ref 80.0–100.0)
MPV: 10.5 fL (ref 7.5–12.5)
Platelets: 256 10*3/uL (ref 140–400)
RBC: 3.87 10*6/uL (ref 3.80–5.10)
RDW: 13.2 % (ref 11.0–15.0)
WBC: 4.3 10*3/uL (ref 3.8–10.8)

## 2022-03-19 LAB — COMPLETE METABOLIC PANEL WITH GFR
AG Ratio: 1.2 (calc) (ref 1.0–2.5)
ALT: 6 U/L (ref 6–29)
AST: 16 U/L (ref 10–35)
Albumin: 4.2 g/dL (ref 3.6–5.1)
Alkaline phosphatase (APISO): 79 U/L (ref 37–153)
BUN: 15 mg/dL (ref 7–25)
CO2: 29 mmol/L (ref 20–32)
Calcium: 9.3 mg/dL (ref 8.6–10.4)
Chloride: 106 mmol/L (ref 98–110)
Creat: 0.92 mg/dL (ref 0.50–1.03)
Globulin: 3.5 g/dL (calc) (ref 1.9–3.7)
Glucose, Bld: 81 mg/dL (ref 65–99)
Potassium: 4.6 mmol/L (ref 3.5–5.3)
Sodium: 141 mmol/L (ref 135–146)
Total Bilirubin: 0.4 mg/dL (ref 0.2–1.2)
Total Protein: 7.7 g/dL (ref 6.1–8.1)
eGFR: 72 mL/min/{1.73_m2} (ref >=60)

## 2022-03-19 LAB — LIPID PANEL
Cholesterol: 159 mg/dL (ref <200)
HDL: 67 mg/dL (ref >=50)
LDL Cholesterol (Calc): 73 mg/dL (calc)
Non-HDL Cholesterol (Calc): 92 mg/dL (calc) (ref <130)
Total CHOL/HDL Ratio: 2.4 (calc) (ref <5.0)
Triglycerides: 106 mg/dL (ref <150)

## 2022-03-19 LAB — HIV-1 RNA QUANT-NO REFLEX-BLD
HIV 1 RNA Quant: 25 Copies/mL — ABNORMAL HIGH
HIV-1 RNA Quant, Log: 1.39 Log cps/mL — ABNORMAL HIGH

## 2022-03-19 NOTE — Telephone Encounter (Signed)
-----   Message from [place] Callas, NP sent at 03/19/2022  4:01 PM EST ----- Please give Christine Dawson a call to let her know that her viral load continues to be in the undetectable range and I am so happy for her! Her immune system is still making some slow recovery as well - which is awesome! Her kidneys and liver function all look great Cholesterol looks great on the medications.  No changes from me and I look forward to seeing her at her next appointment with me.

## 2022-03-19 NOTE — Progress Notes (Signed)
Please give Christine Dawson a call to let her know that her viral load continues to be in the undetectable range and I am so happy for her! Her immune system is still making some slow recovery as well - which is awesome! Her kidneys and liver function all look great Cholesterol looks great on the medications.  No changes from me and I look forward to seeing her at her next appointment with me.

## 2022-03-19 NOTE — Telephone Encounter (Signed)
Called Jaslyne to discuss results, no answer. Left HIPAA compliant voicemail requesting callback.   Beryle Flock, RN

## 2022-03-20 NOTE — Telephone Encounter (Signed)
Attempted to call, unable to reach at this time.

## 2022-04-07 ENCOUNTER — Other Ambulatory Visit (HOSPITAL_COMMUNITY): Payer: Self-pay

## 2022-06-09 ENCOUNTER — Other Ambulatory Visit: Payer: Self-pay | Admitting: Infectious Diseases

## 2022-06-09 DIAGNOSIS — B20 Human immunodeficiency virus [HIV] disease: Secondary | ICD-10-CM

## 2022-06-09 DIAGNOSIS — I10 Essential (primary) hypertension: Secondary | ICD-10-CM

## 2022-06-09 NOTE — Telephone Encounter (Signed)
Do you want to continue refilling? 

## 2022-06-12 ENCOUNTER — Other Ambulatory Visit (HOSPITAL_COMMUNITY): Payer: Self-pay

## 2022-09-23 ENCOUNTER — Other Ambulatory Visit: Payer: Self-pay

## 2022-09-23 ENCOUNTER — Other Ambulatory Visit (HOSPITAL_COMMUNITY)
Admission: RE | Admit: 2022-09-23 | Discharge: 2022-09-23 | Disposition: A | Discharge status: Home / Self Care | Payer: Medicaid Other | Source: Ambulatory Visit | Attending: Infectious Diseases | Admitting: Infectious Diseases

## 2022-09-23 ENCOUNTER — Encounter: Payer: Self-pay | Admitting: Infectious Diseases

## 2022-09-23 ENCOUNTER — Ambulatory Visit (INDEPENDENT_AMBULATORY_CARE_PROVIDER_SITE_OTHER): Payer: Medicaid Other | Admitting: Infectious Diseases

## 2022-09-23 ENCOUNTER — Ambulatory Visit: Payer: Medicaid Other

## 2022-09-23 VITALS — BP 129/78 | HR 76 | Temp 97.8°F | Resp 16 | Wt 121.0 lb

## 2022-09-23 DIAGNOSIS — I10 Essential (primary) hypertension: Secondary | ICD-10-CM

## 2022-09-23 DIAGNOSIS — Z124 Encounter for screening for malignant neoplasm of cervix: Secondary | ICD-10-CM | POA: Diagnosis present

## 2022-09-23 DIAGNOSIS — B20 Human immunodeficiency virus [HIV] disease: Secondary | ICD-10-CM | POA: Diagnosis not present

## 2022-09-23 MED ORDER — SYMTUZA 800-150-200-10 MG PO TABS
1.0000 | ORAL_TABLET | Freq: Every day | ORAL | 11 refills | Status: DC
Start: 2022-09-23 — End: 2022-12-22

## 2022-09-23 MED ORDER — ATORVASTATIN CALCIUM 10 MG PO TABS: 10.0000 mg | ORAL_TABLET | Freq: Every day | ORAL | 11 refills | Status: DC

## 2022-09-23 NOTE — Patient Instructions (Signed)
Refills for symtuza and your cholesterol medication have been sent - we had to switch to a little different name brand for the [facility] program but you won't notice any difference. It is called Atorvastatin.    Looking forward to seeing you again in February. Here for you if you need anything sooner.

## 2022-09-23 NOTE — Progress Notes (Signed)
Name: Christine Dawson  DOB: [birth year] MRN: [id number] PCP: Patient, No Pcp Per    Brief Narrative:  Christine Dawson is a 57 y.o. female with HIV, +AIDS, Dx 09-2006. 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 See Cumulative data report in Overview of Problem.    Subjective:   Chief Complaint  Patient presents with   Follow-up    B20    Gynecologic Exam    PAP      HPI: She is doing well - she is currently working at [employer] and starting back at school next week. Doing good with her medications and please with how she feels. She participates with a group called [employer] and they are walking on behalf of her son for the next walk. Not always easy but the support is good.   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 No concerns for vaginal symptoms. Occasional hot flashes but have calmed down over the years. Not sexually active.   Not sexually active at present.    Review of Systems  Constitutional:  Negative for appetite change, chills, fatigue, fever and unexpected weight change.  HENT:  Negative for mouth sores, sore throat and trouble swallowing.   Eyes:  Negative for pain and visual disturbance.  Respiratory:  Negative for cough and shortness of breath.   Cardiovascular:  Negative for chest pain.  Gastrointestinal:  Negative for abdominal pain, diarrhea and nausea.  Genitourinary:  Negative for dysuria, menstrual problem and pelvic pain.  Musculoskeletal:  Negative for back pain and neck pain.  Skin:  Negative for color  change and rash.  Neurological:  Negative for weakness, numbness and headaches.  Hematological:  Negative for adenopathy.  Psychiatric/Behavioral:  Negative for dysphoric mood. The patient is not nervous/anxious.      Past Medical History:  Diagnosis Date   Abnormal Pap smear 08/2010   HIV infection [facility])     Outpatient Medications Prior to Visit  Medication Sig Dispense Refill   amLODipine (NORVASC) 10 MG tablet TAKE 1 TABLET(10 MG) BY MOUTH DAILY 30 tablet 11   atenolol (TENORMIN) 50 MG tablet TAKE 1 TABLET(50 MG) BY MOUTH DAILY 30 tablet 11   Pitavastatin Calcium 4 MG TABS Take 1 tablet (4 mg total) by mouth daily. 30 tablet 11   SYMTUZA 800-150-200-10 MG TABS TAKE 1 TABLET BY MOUTH DAILY WITH BREAKFAST 30 tablet 5   No facility-administered medications prior to visit.     No Known Allergies  Social History   Tobacco Use   Smoking status: Every Day    Current packs/day: 0.50    Types: Cigarettes    Passive exposure: Never   Smokeless tobacco: Never   Tobacco comments:    cutting back  Vaping Use   Vaping status: Never Used  Substance Use Topics   Alcohol use: Yes    Alcohol/week: 21.0 standard drinks of alcohol    Types: 21 Standard drinks or equivalent per week    Comment: beer  Drug use: Yes    Types: Marijuana    Social History   Substance and Sexual Activity  Sexual Activity Not Currently   Comment: declined condoms     Objective:   Vitals:   09/23/22 1534  BP: 129/78  Pulse: 76  Resp: 16  Temp: 97.8 F (36.6 C)  TempSrc: Temporal  SpO2: 100%  Weight: 121 lb (54.9 kg)   Body mass index is 22.13 kg/m.   Physical Exam Exam conducted with a chaperone present.  HENT:     Mouth/Throat:     Mouth: No oral lesions.     Dentition: No dental abscesses.  Cardiovascular:     Rate and Rhythm: Normal rate and regular rhythm.     Heart sounds: Normal heart sounds.  Pulmonary:     Effort: Pulmonary effort is normal.     Breath sounds: Normal  breath sounds.  Abdominal:     General: There is no distension.     Palpations: Abdomen is soft.     Tenderness: There is no abdominal tenderness.  Genitourinary:    General: Normal vulva.     Pubic Area: No rash.      Vagina: Normal.     Cervix: Normal. No discharge.  Musculoskeletal:        General: No tenderness. Normal range of motion.  Lymphadenopathy:     Cervical: No cervical adenopathy.  Skin:    General: Skin is warm and dry.     Findings: No rash.  Neurological:     Mental Status: She is alert and oriented to person, place, and time.  Psychiatric:        Judgment: Judgment normal.     Lab Results Lab Results  Component Value Date   WBC 4.3 03/17/2022   HGB 12.1 03/17/2022   HCT 36.0 03/17/2022   MCV 93.0 03/17/2022   PLT 256 03/17/2022    Lab Results  Component Value Date   CREATININE 0.92 03/17/2022   BUN 15 03/17/2022   NA 141 03/17/2022   K 4.6 03/17/2022   CL 106 03/17/2022   CO2 29 03/17/2022    Lab Results  Component Value Date   ALT 6 03/17/2022   AST 16 03/17/2022   ALKPHOS 58 06/16/2016   BILITOT 0.4 03/17/2022    Lab Results  Component Value Date   CHOL 159 03/17/2022   HDL 67 03/17/2022   LDLCALC 73 03/17/2022   TRIG 106 03/17/2022   CHOLHDL 2.4 03/17/2022   HIV 1 RNA Quant (Copies/mL)  Date Value  03/17/2022 25 (H)  11/25/2021 Not Detected  08/26/2021 <20 (H)   CD4 T Cell Abs (/uL)  Date Value  03/17/2022 269 (L)  11/25/2021 278 (L)  08/26/2021 232 (L)     Assessment & Plan:   Patient Active Problem List   Diagnosis Date Noted   Healthcare maintenance 12/13/2018   Tobacco abuse 11/10/2011   Hypertension 11/10/2011   LGSIL of cervix of undetermined significance 01/01/2011   Human immunodeficiency virus (HIV) disease (HCC) 09/28/2006   History of syphilis 09/28/2006   Alcohol abuse 09/28/2006   History of hepatitis B 09/28/2006    Problem List Items Addressed This Visit       Unprioritized   Human  immunodeficiency virus (HIV) disease (HCC) - Primary (Chronic)    Very well controlled on once daily Symtuza. No concerns with access or adherence to medication. They are tolerating the medication well without side effects. No drug interactions identified (statin has been  dose adjusted appropriately). Pertinent lab tests ordered today.  Reminded about ADAP re-enrollment in July / January.  No changes to insurance coverage.  No dental needs today.  No concern over anxious/depressed mood.  Sexual health and family planning discussed - pap smear for cervical cancer screening completed today. .  Flu/COVID booster recommended around October annually  On statin (changed to atorvastatin today for preferred formulary)   Return in about 6 months (around 03/26/2023).        Relevant Medications   Darunavir-Cobicistat-Emtricitabine-Tenofovir Alafenamide (SYMTUZA) 800-150-200-10 MG TABS   Other Relevant Orders   HIV 1 RNA quant-no reflex-bld   T-helper cells (CD4) count   Hypertension (Chronic)    BP Readings from Last 3 Encounters:  09/23/22 129/78  03/17/22 118/75  11/25/21 115/69   Well controlled on current regimen atenolol 50 mg and amlodipine 10 mg daily - she has refills.       Relevant Medications   atorvastatin (LIPITOR) 10 MG tablet   Other Visit Diagnoses     Essential hypertension       Relevant Medications   atorvastatin (LIPITOR) 10 MG tablet   Screening for cervical cancer       Relevant Orders   Cytology - PAP( [facility])      Rexene Alberts, MSN, NP-C [facility] Pager: [phone] Office: [phone]  09/23/22  4:06 PM

## 2022-09-23 NOTE — Assessment & Plan Note (Addendum)
Very well controlled on once daily Symtuza. No concerns with access or adherence to medication. They are tolerating the medication well without side effects. No drug interactions identified (statin has been dose adjusted appropriately). Pertinent lab tests ordered today.  Reminded about ADAP re-enrollment in July / January.  No changes to insurance coverage.  No dental needs today.  No concern over anxious/depressed mood.  Sexual health and family planning discussed - pap smear for cervical cancer screening completed today. .  Flu/COVID booster recommended around October annually  On statin (changed to atorvastatin today for preferred formulary)   Return in about 6 months (around 03/26/2023).

## 2022-09-23 NOTE — Assessment & Plan Note (Signed)
BP Readings from Last 3 Encounters:  09/23/22 129/78  03/17/22 118/75  11/25/21 115/69   Well controlled on current regimen atenolol 50 mg and amlodipine 10 mg daily - she has refills.

## 2022-09-24 ENCOUNTER — Other Ambulatory Visit: Payer: Self-pay | Admitting: Infectious Diseases

## 2022-09-24 LAB — T-HELPER CELLS (CD4) COUNT (NOT AT ARMC)
CD4 % Helper T Cell: 17 % — ABNORMAL LOW (ref 33–65)
CD4 T Cell Abs: 278 /uL — ABNORMAL LOW (ref 400–1790)

## 2022-09-25 LAB — HIV-1 RNA QUANT-NO REFLEX-BLD
HIV 1 RNA Quant: 46 {copies}/mL — ABNORMAL HIGH
HIV-1 RNA Quant, Log: 1.66 {Log_copies}/mL — ABNORMAL HIGH

## 2022-09-26 LAB — CYTOLOGY - PAP
Comment: NEGATIVE
Diagnosis: NEGATIVE
High risk HPV: NEGATIVE

## 2022-11-04 ENCOUNTER — Telehealth: Payer: Self-pay | Admitting: Pharmacist

## 2022-11-04 ENCOUNTER — Other Ambulatory Visit (HOSPITAL_COMMUNITY): Payer: Self-pay

## 2022-11-04 ENCOUNTER — Telehealth: Payer: Self-pay

## 2022-11-04 NOTE — Telephone Encounter (Signed)
PA denied.  Insurance requires trial and failure with preferred drugs ( Prezcobix with generic Truvada, Biktarvy, Odefsey, Genvoya) Juanita Laster, RMA

## 2022-11-04 NOTE — Telephone Encounter (Signed)
PA for Symtuza sent to plan via covermymeds. Awaiting response.   Key: [id number]  Sandie Ano, RN

## 2022-11-04 NOTE — Telephone Encounter (Addendum)
Cumulative HIV Genotype Data  Genotype Dates: 06/12/2008, 01/23/2011, 02/04/2017, 10/28/2018, 10/03/2019, 11/29/2020, 03/25/2021    RT Mutations  M184V, K101KE, E138EK, T69NS, V118I, R211K, F214L, A62V, K70R, P4S, V35D, E36IM, V60I, I63S, I135V, I142V, D177E, T286A, I293V, E297K, G333E, Q334H, G335S    PI Mutations  L10V, E35D, M36MIL, I62V, l63AS, I15V, L19Q, R41K    Integrase Mutations  G140S, Q148H, M50I, D232E, E138K, N155H, D232E    Interpretation of Genotype Data per [employer] HIV Drug Resistance Database:   Nucleoside RTIs  Abacavir - low level resistance Zidovudine - low level resistance  Emtricitabine - high level resistance Lamivudine - high level resistance Tenofovir - susceptible    Non-Nucleoside RTIs  Doravirine - low level resistance Efavirenz - low level resistance Etravirine - low level resistance Nevirapine - intermediate resistance Rilpivirine - high level resistance    Protease Inhibitors  Atazanavir - susceptible Darunavir - susceptible Lopinavir - susceptible    Integrase Inhibitors  Bictegravir - high level resistance Cabotegravir - high level resistance Dolutegravir - high level resistance Elvitegravir - high level resistance Raltegravir - high level resistance   Margarite Gouge, PharmD, CPP, BCIDP, AAHIVP Clinical Pharmacist Practitioner Infectious Diseases Clinical Pharmacist [facility]

## 2022-11-04 NOTE — Telephone Encounter (Signed)
We will happily appeal this decision. Will attach her updated cumulative genotype. Thanks! Marchelle Folks

## 2022-11-04 NOTE — Telephone Encounter (Signed)
10000% here for it. Will include every possible reason we can think of to stick with one pill and to avoid TDF. :)

## 2022-11-04 NOTE — Telephone Encounter (Signed)
Unfortunately those options are not doable with resistance   Marchelle Folks can you help with getting insurance to continue with the symtuza? I know I have a cumulative genotype in the chart somewhere.

## 2022-11-12 NOTE — Telephone Encounter (Signed)
Fredric Mare wrote an appeal for Christine Dawson. Submitted and faxed it today - will await insurance's response. Thanks!

## 2022-11-21 ENCOUNTER — Other Ambulatory Visit (HOSPITAL_COMMUNITY): Payer: Self-pay

## 2022-12-02 ENCOUNTER — Other Ambulatory Visit (HOSPITAL_COMMUNITY): Payer: Self-pay

## 2022-12-08 ENCOUNTER — Telehealth: Payer: Self-pay

## 2022-12-08 ENCOUNTER — Other Ambulatory Visit (HOSPITAL_COMMUNITY): Payer: Self-pay

## 2022-12-08 NOTE — Telephone Encounter (Signed)
FYI

## 2022-12-08 NOTE — Telephone Encounter (Signed)
[facility] Patient Advocate Encounter  Prior Authorization for Christine Dawson has been approved.    PA# [id number] Effective dates: 12/05/22 through 12/05/23  Patients co-pay is $0.00.   [facility] will continue to follow.  Clearance Coots, CPhT [facility]ty Pharmacy Patient [name] Phone: [phone] Fax:  [phone]

## 2022-12-22 ENCOUNTER — Other Ambulatory Visit: Payer: Self-pay | Admitting: Infectious Diseases

## 2022-12-22 ENCOUNTER — Other Ambulatory Visit (HOSPITAL_COMMUNITY): Payer: Self-pay

## 2022-12-22 DIAGNOSIS — B20 Human immunodeficiency virus [HIV] disease: Secondary | ICD-10-CM

## 2022-12-22 MED ORDER — SYMTUZA 800-150-200-10 MG PO TABS
1.0000 | ORAL_TABLET | Freq: Every day | ORAL | 5 refills | Status: DC
Start: 2022-12-22 — End: 2023-05-06

## 2022-12-26 ENCOUNTER — Other Ambulatory Visit (HOSPITAL_COMMUNITY): Payer: Self-pay

## 2023-01-06 ENCOUNTER — Other Ambulatory Visit (HOSPITAL_COMMUNITY): Payer: Self-pay

## 2023-03-16 ENCOUNTER — Ambulatory Visit: Payer: Self-pay | Admitting: Infectious Diseases

## 2023-03-17 ENCOUNTER — Ambulatory Visit: Payer: Medicaid Other | Admitting: Infectious Diseases

## 2023-05-06 ENCOUNTER — Encounter: Payer: Self-pay | Admitting: Infectious Diseases

## 2023-05-06 ENCOUNTER — Ambulatory Visit (INDEPENDENT_AMBULATORY_CARE_PROVIDER_SITE_OTHER): Admitting: Infectious Diseases

## 2023-05-06 ENCOUNTER — Other Ambulatory Visit: Payer: Self-pay

## 2023-05-06 DIAGNOSIS — J309 Allergic rhinitis, unspecified: Secondary | ICD-10-CM

## 2023-05-06 DIAGNOSIS — B20 Human immunodeficiency virus [HIV] disease: Secondary | ICD-10-CM | POA: Diagnosis present

## 2023-05-06 DIAGNOSIS — I10 Essential (primary) hypertension: Secondary | ICD-10-CM

## 2023-05-06 MED ORDER — ATORVASTATIN CALCIUM 10 MG PO TABS: 10.0000 mg | ORAL_TABLET | Freq: Every day | ORAL | 11 refills | Status: AC

## 2023-05-06 MED ORDER — SYMTUZA 800-150-200-10 MG PO TABS
1.0000 | ORAL_TABLET | Freq: Every day | ORAL | 11 refills | Status: AC
Start: 2023-05-06 — End: ?

## 2023-05-06 MED ORDER — ATENOLOL 50 MG PO TABS: 50.0000 mg | ORAL_TABLET | Freq: Every day | ORAL | 11 refills | Status: AC

## 2023-05-06 MED ORDER — AMLODIPINE BESYLATE 10 MG PO TABS: 10.0000 mg | ORAL_TABLET | Freq: Every day | ORAL | 11 refills | Status: AC

## 2023-05-06 NOTE — Progress Notes (Signed)
 Name: Christine Dawson  DOB: [birth year] MRN: [id number] PCP: Patient, No Pcp Per    Brief Narrative:  Christine Dawson is a 58 y.o. female with HIV, +AIDS, Dx 09-2006. 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 See Cumulative data report in Overview of Problem.    Subjective:   Chief Complaint  Patient presents with   Follow-up    Discussed the use of AI scribe software for clinical note transcription with the patient, who gave verbal consent to proceed.  History of Present Illness   Christine Dawson is a 58 year old female with HIV who presents for a routine follow-up visit.  She has HIV, with a well-controlled viral load as of August. Her immune system is stronger, and she is not experiencing significant concerns related to her HIV management. She is on Symtuza for HIV treatment.  She is experiencing severe allergy symptoms due to pollen exposure. She has not started any allergy medications yet.  Her blood pressure is well-controlled with amlodipine and atenolol. She confirms having enough medication and does not require refills at this time.  No concerns with mood, sleep, or eating habits, and no issues with alcohol use.  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 She also works at [employer] and enjoys the environment there.       Pap Aug 2024 normal without any concerns.    Review of Systems  Constitutional:  Negative for appetite change, chills, fatigue, fever and unexpected weight change.  HENT:   Negative for mouth sores, sore throat and trouble swallowing.   Eyes:  Negative for pain and visual disturbance.  Respiratory:  Negative for cough and shortness of breath.   Cardiovascular:  Negative for chest pain.  Gastrointestinal:  Negative for abdominal pain, diarrhea and nausea.  Genitourinary:  Negative for dysuria, menstrual problem and pelvic pain.  Musculoskeletal:  Negative for back pain and neck pain.  Skin:  Negative for color change and rash.  Neurological:  Negative for weakness, numbness and headaches.  Hematological:  Negative for adenopathy.  Psychiatric/Behavioral:  Negative for dysphoric mood. The patient is not nervous/anxious.      Past Medical History:  Diagnosis Date   Abnormal Pap smear 08/2010   HIV infection [facility])     Outpatient Medications Prior to Visit  Medication Sig Dispense Refill   amLODipine (NORVASC) 10 MG tablet TAKE 1 TABLET(10 MG) BY MOUTH DAILY 30 tablet 11   atenolol (TENORMIN) 50 MG tablet TAKE 1 TABLET(50 MG) BY MOUTH DAILY 30 tablet 11   atorvastatin (LIPITOR) 10 MG tablet Take 1 tablet (10 mg total) by mouth daily. 30 tablet 11   Darunavir-Cobicistat-Emtricitabine-Tenofovir Alafenamide (SYMTUZA) 800-150-200-10 MG TABS Take 1 tablet by mouth daily with breakfast. 30 tablet 5   No facility-administered medications prior to visit.     No Known Allergies  Social History   Tobacco Use   Smoking status: Every Day    Current packs/day: 0.50  Types: Cigarettes    Passive exposure: Never   Smokeless tobacco: Never   Tobacco comments:    cutting back  Vaping Use   Vaping status: Never Used  Substance Use Topics   Alcohol use: Yes    Alcohol/week: 21.0 standard drinks of alcohol    Types: 21 Standard drinks or equivalent per week    Comment: beer   Drug use: Yes    Types: Marijuana    Social History   Substance and Sexual Activity  Sexual Activity Not Currently   Comment: declined condoms     Objective:   Vitals:    05/06/23 1446  BP: 105/65  Pulse: 77  Temp: 97.8 F (36.6 C)  TempSrc: Temporal  SpO2: 100%  Weight: 124 lb (56.2 kg)  Height: 5\' 2"  (1.575 m)   Body mass index is 22.68 kg/m.   Physical Exam Constitutional:      Appearance: Normal appearance. She is not ill-appearing.  HENT:     Mouth/Throat:     Mouth: Mucous membranes are moist.     Pharynx: Oropharynx is clear.  Eyes:     General: No scleral icterus. Cardiovascular:     Rate and Rhythm: Normal rate and regular rhythm.  Pulmonary:     Effort: Pulmonary effort is normal.  Neurological:     Mental Status: She is oriented to person, place, and time.  Psychiatric:        Mood and Affect: Mood normal.        Thought Content: Thought content normal.     Lab Results Lab Results  Component Value Date   WBC 4.3 03/17/2022   HGB 12.1 03/17/2022   HCT 36.0 03/17/2022   MCV 93.0 03/17/2022   PLT 256 03/17/2022    Lab Results  Component Value Date   CREATININE 0.92 03/17/2022   BUN 15 03/17/2022   NA 141 03/17/2022   K 4.6 03/17/2022   CL 106 03/17/2022   CO2 29 03/17/2022    Lab Results  Component Value Date   ALT 6 03/17/2022   AST 16 03/17/2022   ALKPHOS 58 06/16/2016   BILITOT 0.4 03/17/2022    Lab Results  Component Value Date   CHOL 159 03/17/2022   HDL 67 03/17/2022   LDLCALC 73 03/17/2022   TRIG 106 03/17/2022   CHOLHDL 2.4 03/17/2022   HIV 1 RNA Quant (Copies/mL)  Date Value  09/23/2022 46 (H)  03/17/2022 25 (H)  11/25/2021 Not Detected   CD4 T Cell Abs (/uL)  Date Value  09/23/2022 278 (L)  03/17/2022 269 (L)  11/25/2021 278 (L)     Assessment & Plan:     HIV infection -  Virologically Controlled, CD4 >250 -  HIV well-controlled with low viral load and strong immune system. Current antiretroviral therapy, Symtuza, effective with no new concerns. Pap smear up to date and due in 2027. On statin therapy for primary cardiac prevention.  - Continue Symtuza. - Follow-up blood work in  July or August to monitor viral load and immune function. - Ensure medication refills are up to date.   Allergic rhinitis - Symptoms likely due to pollen exposure. Advised against Flonase due to interaction with Symtuza.  - Recommend Claritin, Allegra, or Zyrtec. - Advise against Flonase nasal spray.  Hypertension - Blood pressure well-controlled on amlodipine and atenolol. No issues reported. - Continue amlodipine and atenolol. - Ensure medication refills are up to date.      Meds ordered this encounter  Medications  Darunavir-Cobicistat-Emtricitabine-Tenofovir Alafenamide (SYMTUZA) 800-150-200-10 MG TABS    Sig: Take 1 tablet by mouth daily with breakfast.    Dispense:  30 tablet    Refill:  11    Prescription Type::   Renewal   atorvastatin (LIPITOR) 10 MG tablet    Sig: Take 1 tablet (10 mg total) by mouth daily.    Dispense:  30 tablet    Refill:  11   atenolol (TENORMIN) 50 MG tablet    Sig: Take 1 tablet (50 mg total) by mouth daily.    Dispense:  30 tablet    Refill:  11   amLODipine (NORVASC) 10 MG tablet    Sig: Take 1 tablet (10 mg total) by mouth daily.    Dispense:  30 tablet    Refill:  11   No orders of the defined types were placed in this encounter.  Return in about 3 months (around 08/05/2023).   Rexene Alberts, MSN, NP-C [facility] Pager: [phone] Office: [phone]  05/06/23  4:34 PM

## 2023-05-06 NOTE — Patient Instructions (Addendum)
 I sent in your refills for symtuza, cholesterol medication and blood pressure pills to avoid delays for you.   Will plan to do labs at your follow up in July / August.   Any of the over the counter allergy pills are fine - Claritin, Zyrtec, Allegra.   Avoid the nasal spray Flonase - can do Saline nasal spray or Astepro Allergy (azelastine) spray

## 2023-06-10 ENCOUNTER — Telehealth: Payer: Self-pay

## 2023-06-10 MED ORDER — ENSURE PO LIQD: 237.0000 mL | Freq: Two times a day (BID) | ORAL | 11 refills | Status: DC

## 2023-06-10 NOTE — Telephone Encounter (Signed)
 Patient called requesting an Ensure prescription to be sent to [facility]. Please advise. Christine Dawson, CMA

## 2023-06-10 NOTE — Telephone Encounter (Signed)
 Printed Rx provided.  Please make sure Christine Dawson does not take the Ensure supplements within 4 hours of her HIV medication to ensure it is absorbed well.   I hope she is doing well.

## 2023-06-10 NOTE — Addendum Note (Signed)
 Addended by: Orson Blalock on: 06/10/2023 02:22 PM   Modules accepted: Orders

## 2023-06-11 NOTE — Telephone Encounter (Signed)
 Left patient a detailed voicemail with directions on how to take the Ensure and advised her that the RX has been given to [facility]. Lynita Groseclose Adel Holt, CMA

## 2023-06-24 NOTE — Progress Notes (Signed)
 The 10-year ASCVD risk score (Arnett DK, et al., 2019) is: 5%   Values used to calculate the score:     Age: 58 years     Sex: Female     Is Non-Hispanic African American: Yes     Diabetic: No     Tobacco smoker: Yes     Systolic Blood Pressure: 105 mmHg     Is BP treated: Yes     HDL Cholesterol: 67 mg/dL     Total Cholesterol: 159 mg/dL  Currently prescribed atorvastatin  10 mg.  Cullin Dishman, BSN, RN

## 2023-08-04 ENCOUNTER — Other Ambulatory Visit (HOSPITAL_COMMUNITY): Payer: Self-pay

## 2023-08-05 ENCOUNTER — Ambulatory Visit: Admitting: Infectious Diseases

## 2023-08-10 ENCOUNTER — Ambulatory Visit

## 2023-08-26 ENCOUNTER — Ambulatory Visit: Admitting: Infectious Diseases

## 2023-10-26 ENCOUNTER — Encounter: Payer: Self-pay | Admitting: Infectious Diseases

## 2023-10-26 ENCOUNTER — Other Ambulatory Visit: Payer: Self-pay

## 2023-10-26 ENCOUNTER — Ambulatory Visit (INDEPENDENT_AMBULATORY_CARE_PROVIDER_SITE_OTHER): Admitting: Infectious Diseases

## 2023-10-26 VITALS — BP 129/79 | HR 72 | Temp 98.3°F | Wt 129.0 lb

## 2023-10-26 DIAGNOSIS — I10 Essential (primary) hypertension: Secondary | ICD-10-CM

## 2023-10-26 DIAGNOSIS — Z23 Encounter for immunization: Secondary | ICD-10-CM | POA: Diagnosis not present

## 2023-10-26 DIAGNOSIS — B20 Human immunodeficiency virus [HIV] disease: Secondary | ICD-10-CM | POA: Diagnosis present

## 2023-10-26 DIAGNOSIS — F17210 Nicotine dependence, cigarettes, uncomplicated: Secondary | ICD-10-CM

## 2023-10-26 DIAGNOSIS — Z1231 Encounter for screening mammogram for malignant neoplasm of breast: Secondary | ICD-10-CM

## 2023-10-26 NOTE — Patient Instructions (Addendum)
 No changes in your medications   Pap smear is due in 2027 next  Will see you back in 6 months to check in again   Would consider getting the shingles vaccine also - we don't have it here in the office but    Smoking Cessation: Quitline[place] 1-800-QUIT-NOW (207)296-8376); Espaol: 1-855-Djelo-Ya (1-360-832-8832) [email or web address].[email or web address]

## 2023-10-26 NOTE — Progress Notes (Signed)
 Name: Christine Dawson  DOB: [birth year] MRN: [id number] PCP: Patient, No Pcp Per    Brief Narrative:  Christine Dawson is a 58 y.o. female with HIV, +AIDS, Dx 09-2006. 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 See Cumulative data report in Overview of Problem.    Subjective:   Chief Complaint  Patient presents with   Follow-up    Discussed the use of AI scribe software for clinical note transcription with the patient, who gave verbal consent to proceed.  History of Present Illness   Christine Dawson is a 58 year old female who presents for routine follow-up HIV care.  She is on a stable regimen of Symtuza  once daily with food for her HIV infection, with no current concerns or changes in management discussed during this visit.  Her blood pressure is well-controlled with atenolol  50 mg and amlodipine  10 mg. She received her flu shot during the visit. She recalls possibly having received only one dose of the measles vaccine.  Her last Pap smear was in August of the previous year and was normal. Her mother had breast cancer.  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 She mentions the recent passing of a friend, which has been weighing on her, and she is also concerned about another friend who was diagnosed with thyroid cancer. She wants to find out about her deceased friend's son, whom she refers to as 'my baby'. She is waiting for contact from [facility] regarding transportation assistance, as it is difficult for her to catch the bus. She plans to reach out to them tomorrow and is  offered bus passes in the meantime.  No current worries or concerns, her mood is okay, and she is sleeping well.      Pap Aug 2024 normal without any concerns - next due 2027.   Review of Systems  Constitutional:  Negative for appetite change, chills, fatigue, fever and unexpected weight change.  HENT:  Negative for mouth sores, sore throat and trouble swallowing.   Eyes:  Negative for pain and visual disturbance.  Respiratory:  Negative for cough and shortness of breath.   Cardiovascular:  Negative for chest pain.  Gastrointestinal:  Negative for abdominal pain, diarrhea and nausea.  Genitourinary:  Negative for dysuria, menstrual problem and pelvic pain.  Musculoskeletal:  Negative for back pain and neck pain.  Skin:  Negative for color change and rash.  Neurological:  Negative for weakness, numbness and headaches.  Hematological:  Negative for adenopathy.  Psychiatric/Behavioral:  Negative for dysphoric mood. The patient is not nervous/anxious.      Past Medical History:  Diagnosis Date   Abnormal Pap smear 08/2010   HIV infection [facility])     Outpatient Medications Prior to Visit  Medication Sig Dispense Refill   amLODipine  (NORVASC ) 10 MG tablet Take 1 tablet (10 mg total) by mouth daily. 30 tablet 11   atenolol  (TENORMIN ) 50 MG tablet Take 1 tablet (50 mg total) by mouth daily. 30 tablet 11   atorvastatin  (  LIPITOR) 10 MG tablet Take 1 tablet (10 mg total) by mouth daily. 30 tablet 11   Darunavir -Cobicistat -Emtricitabine -Tenofovir  Alafenamide (SYMTUZA ) 800-150-200-10 MG TABS Take 1 tablet by mouth daily with breakfast. 30 tablet 11   Ensure (ENSURE) Take 237 mLs by mouth 2 (two) times daily between meals. Separate from other medications by at least 4 hours so not in the stomach together 237 mL 11   No facility-administered medications prior to visit.     No Known Allergies  Social History   Tobacco Use   Smoking status: Every Day    Current packs/day: 0.50    Types:  Cigarettes    Passive exposure: Never   Smokeless tobacco: Never   Tobacco comments:    cutting back  Vaping Use   Vaping status: Never Used  Substance Use Topics   Alcohol use: Yes    Alcohol/week: 21.0 standard drinks of alcohol    Types: 21 Standard drinks or equivalent per week    Comment: beer   Drug use: Yes    Types: Marijuana    Social History   Substance and Sexual Activity  Sexual Activity Not Currently   Comment: declined condoms     Objective:   Vitals:   10/26/23 1433  BP: 129/79  Pulse: 72  Temp: 98.3 F (36.8 C)  TempSrc: Oral  SpO2: 100%  Weight: 129 lb (58.5 kg)   Body mass index is 23.59 kg/m.   Physical Exam Constitutional:      Appearance: Normal appearance. She is not ill-appearing.  HENT:     Mouth/Throat:     Mouth: Mucous membranes are moist.     Pharynx: Oropharynx is clear.  Eyes:     General: No scleral icterus. Cardiovascular:     Rate and Rhythm: Normal rate and regular rhythm.  Pulmonary:     Effort: Pulmonary effort is normal.  Neurological:     Mental Status: She is oriented to person, place, and time.  Psychiatric:        Mood and Affect: Mood normal.        Thought Content: Thought content normal.     Lab Results Lab Results  Component Value Date   WBC 4.3 03/17/2022   HGB 12.1 03/17/2022   HCT 36.0 03/17/2022   MCV 93.0 03/17/2022   PLT 256 03/17/2022    Lab Results  Component Value Date   CREATININE 0.92 03/17/2022   BUN 15 03/17/2022   NA 141 03/17/2022   K 4.6 03/17/2022   CL 106 03/17/2022   CO2 29 03/17/2022    Lab Results  Component Value Date   ALT 6 03/17/2022   AST 16 03/17/2022   ALKPHOS 58 06/16/2016   BILITOT 0.4 03/17/2022    Lab Results  Component Value Date   CHOL 159 03/17/2022   HDL 67 03/17/2022   LDLCALC 73 03/17/2022   TRIG 106 03/17/2022   CHOLHDL 2.4 03/17/2022   HIV 1 RNA Quant (Copies/mL)  Date Value  09/23/2022 46 (H)  03/17/2022 25 (H)  11/25/2021 Not Detected    CD4 T Cell Abs (/uL)  Date Value  09/23/2022 278 (L)  03/17/2022 269 (L)  11/25/2021 278 (L)     Assessment & Plan:       HIV infection - Virologically Controlled, CD4 278 - HIV well-controlled with low viral load and strong immune system. Current antiretroviral therapy, Symtuza , effective with no new concerns. Pap smear up to date and due in 2027. On statin therapy for  primary cardiac prevention.   Hypertension - Blood pressure is well-controlled on current therapy. - Continue atenolol  50 mg and amlodipine  10 mg.  Routine health maintenance and preventive care - Blood pressure is well-controlled. Received flu shot. Pap smear was normal in August last year. Mammogram is due, especially given family history of breast cancer. 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 - Schedule mammogram. - Provide bus passes for transportation. - Update blood work.    No orders of the defined types were placed in this encounter.  Orders Placed This Encounter  Procedures   MM 3D SCREENING MAMMOGRAM BILATERAL BREAST    Standing Status:   Future    Expiration Date:   10/25/2024    Reason for Exam (SYMPTOM  OR DIAGNOSIS REQUIRED):   screening for breast cancer    Is the patient pregnant?:   No    Preferred imaging location?:   [facility]   Flu vaccine trivalent PF, 6mos and older(Flulaval,Afluria,Fluarix,Fluzone)   HIV 1 RNA quant-no reflex-bld   T-helper cells (CD4) count   RPR   COMPLETE METABOLIC PANEL WITHOUT GFR   CBC w/Diff   Lipid panel   Measles/Mumps/Rubella Immunity   Return in about 6 months (around 04/24/2024).   Corean Fireman, MSN, NP-C [facility] Pager: [phone] Office: [phone]  10/26/23  3:22 PM

## 2023-10-27 LAB — T-HELPER CELLS (CD4) COUNT (NOT AT ARMC)
CD4 % Helper T Cell: 22 % — ABNORMAL LOW (ref 33–65)
CD4 T Cell Abs: 349 /uL — ABNORMAL LOW (ref 400–1790)

## 2023-10-28 LAB — RPR: RPR Ser Ql: NONREACTIVE

## 2023-10-28 LAB — MEASLES/MUMPS/RUBELLA IMMUNITY
Mumps IgG: <9 [AU]/ml — ABNORMAL LOW
Rubella: 23.8 {index}
Rubeola IgG: <13.5 [AU]/ml — ABNORMAL LOW

## 2023-10-28 LAB — CBC WITH DIFFERENTIAL/PLATELET
Absolute Lymphocytes: 1578 {cells}/uL (ref 850–3900)
Absolute Monocytes: 396 {cells}/uL (ref 200–950)
Basophils Absolute: 60 {cells}/uL (ref 0–200)
Basophils Relative: 1.3 %
Eosinophils Absolute: 161 {cells}/uL (ref 15–500)
Eosinophils Relative: 3.5 %
HCT: 36 % (ref 35.0–45.0)
Hemoglobin: 11.5 g/dL — ABNORMAL LOW (ref 11.7–15.5)
MCH: 31.2 pg (ref 27.0–33.0)
MCHC: 31.9 g/dL — ABNORMAL LOW (ref 32.0–36.0)
MCV: 97.6 fL (ref 80.0–100.0)
MPV: 10.1 fL (ref 7.5–12.5)
Monocytes Relative: 8.6 %
Neutro Abs: 2406 {cells}/uL (ref 1500–7800)
Neutrophils Relative %: 52.3 %
Platelets: 223 Thousand/uL (ref 140–400)
RBC: 3.69 Million/uL — ABNORMAL LOW (ref 3.80–5.10)
RDW: 13.1 % (ref 11.0–15.0)
Total Lymphocyte: 34.3 %
WBC: 4.6 Thousand/uL (ref 3.8–10.8)

## 2023-10-28 LAB — COMPLETE METABOLIC PANEL WITHOUT GFR
AG Ratio: 1.2 (calc) (ref 1.0–2.5)
ALT: 9 U/L (ref 6–29)
AST: 18 U/L (ref 10–35)
Albumin: 4.2 g/dL (ref 3.6–5.1)
Alkaline phosphatase (APISO): 77 U/L (ref 37–153)
BUN: 14 mg/dL (ref 7–25)
CO2: 28 mmol/L (ref 20–32)
Calcium: 8.7 mg/dL (ref 8.6–10.4)
Chloride: 106 mmol/L (ref 98–110)
Creat: 0.9 mg/dL (ref 0.50–1.03)
Globulin: 3.5 g/dL (ref 1.9–3.7)
Glucose, Bld: 87 mg/dL (ref 65–99)
Potassium: 4.9 mmol/L (ref 3.5–5.3)
Sodium: 139 mmol/L (ref 135–146)
Total Bilirubin: 0.4 mg/dL (ref 0.2–1.2)
Total Protein: 7.7 g/dL (ref 6.1–8.1)

## 2023-10-28 LAB — LIPID PANEL
Cholesterol: 138 mg/dL (ref <200)
HDL: 67 mg/dL (ref >=50)
LDL Cholesterol (Calc): 48 mg/dL
Non-HDL Cholesterol (Calc): 71 mg/dL (ref <130)
Total CHOL/HDL Ratio: 2.1 (calc) (ref <5.0)
Triglycerides: 146 mg/dL (ref <150)

## 2023-10-28 LAB — HIV-1 RNA QUANT-NO REFLEX-BLD
HIV 1 RNA Quant: <20 {copies}/mL — AB
HIV-1 RNA Quant, Log: <1.3 {Log_copies}/mL — AB

## 2023-10-29 ENCOUNTER — Ambulatory Visit: Payer: Self-pay | Admitting: Infectious Diseases

## 2023-11-05 ENCOUNTER — Ambulatory Visit

## 2023-11-11 ENCOUNTER — Telehealth: Payer: Self-pay

## 2023-11-11 NOTE — Telephone Encounter (Signed)
 Patient left voicemail wanting to know if her flu shot should still be causing pain in her arm.   Called Rhilyn, she reports pain in her right arm that extends from her elbow to her shoulder and down to her hand. Denies swelling. The pain started early this morning.   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 She denies any changes to her vision or balance. Denies numbness or tingling. States sometimes the right arm feels weak, but that it mostly just hurts, especially when lifting or moving.   She took tylenol this morning, but has not had much relief.   Discussed that this is unlikely related to her flu vaccine as this was administered over 2 weeks ago and the pain just started today.   Hamza Empson, BSN, RN

## 2023-11-16 ENCOUNTER — Encounter (HOSPITAL_COMMUNITY): Payer: Self-pay | Admitting: *Deleted

## 2023-11-16 ENCOUNTER — Telehealth: Payer: Self-pay

## 2023-11-16 ENCOUNTER — Ambulatory Visit (HOSPITAL_COMMUNITY): Admission: EM | Admit: 2023-11-16 | Discharge: 2023-11-16 | Disposition: A | Discharge status: Home / Self Care

## 2023-11-16 DIAGNOSIS — B029 Zoster without complications: Secondary | ICD-10-CM | POA: Diagnosis not present

## 2023-11-16 MED ORDER — IBUPROFEN 800 MG PO TABS: 800.0000 mg | ORAL_TABLET | Freq: Three times a day (TID) | ORAL | 0 refills | Status: AC

## 2023-11-16 MED ORDER — VALACYCLOVIR HCL 1 G PO TABS: 1000.0000 mg | ORAL_TABLET | Freq: Three times a day (TID) | ORAL | 0 refills | Status: AC

## 2023-11-16 MED ORDER — GABAPENTIN 300 MG PO CAPS: ORAL_CAPSULE | ORAL | 0 refills | Status: AC

## 2023-11-16 NOTE — Telephone Encounter (Signed)
 Spoke with Trinka, reviewed that flu shot not likely the cause of her pain. She states she went to urgent care today and was diagnosed with shingles, thinks this is what was causing her pain.   Discussed other potential causes per Corean Fireman, NP such as overuse/repetitive motion and the recommendation to rest, apply ice, and use Tylenol as needed.   She is asking about when she can get her shingles vaccine. Discussed that she should wait until fully recovered from current illness and can discuss with provider at next appointment.   Rony Ratz, BSN, RN

## 2023-11-16 NOTE — Discharge Instructions (Addendum)
 You were diagnosed with shingles outbreak, which is the same virus that causes chickenpox. And is important you take valacyclovir 1000 mg 3 times daily for next 10 days to open rash go away soon as possible Please take ibuprofen  800 mg every 6-8 hours as needed for pain Please take gabapentin for breakthrough pain in accordance with ramp up provided on pharmacy directions Try to leave rash alone is much as possible and not apply topical medication. Follow-up if rash worsening

## 2023-11-16 NOTE — ED Provider Notes (Signed)
 [facility]    CSN: [id number] Arrival date & time: 11/16/23  1013      History   Chief Complaint Chief Complaint  Patient presents with   Rash    HPI Christine Dawson is a 58 y.o. female.   HPI  Patient is 58 year old female who is here today for acute onset painful rash that began on Friday.  She does have spots under her left breast that wrap around to her back. She has been putting some alcohol and creams, powder with corn starch.  Thinks rash may be from bug spray but she is unsure.  Did not witness any bug bites.  Denies any fevers or chills.  Does endorse painful burning.  Of note patient has HIV that is controlled on Symtuza  with CD4 of 278.  Denies any neurologic changes.  No focal deficits at this time.  No vision changes.  Past Medical History:  Diagnosis Date   Abnormal Pap smear 08/2010   HIV infection [facility])     Patient Active Problem List   Diagnosis Date Noted   Healthcare maintenance 12/13/2018   Tobacco abuse 11/10/2011   Hypertension 11/10/2011   LGSIL of cervix of undetermined significance 01/01/2011   Human immunodeficiency virus (HIV) disease (HCC) 09/28/2006   History of syphilis 09/28/2006   Alcohol abuse 09/28/2006   History of hepatitis B 09/28/2006    Past Surgical History:  Procedure Laterality Date   CESAREAN SECTION  1987    OB History     Gravida  4   Para  4   Term  4   Preterm  0   AB  0   Living  4      SAB  0   IAB  0   Ectopic  0   Multiple  0   Live Births               Home Medications    Prior to Admission medications   Medication Sig Start Date End Date Taking? Authorizing Provider  amLODipine  (NORVASC ) 10 MG tablet Take 1 tablet (10 mg total) by mouth daily. 05/06/23  Yes Melvenia Corean SAILOR, NP  atenolol  (TENORMIN ) 50 MG tablet Take 1 tablet (50 mg total) by mouth daily. 05/06/23  Yes Melvenia Corean SAILOR, NP  atorvastatin  (LIPITOR) 10 MG tablet Take 1 tablet (10 mg total) by mouth daily.  05/06/23  Yes Melvenia Corean SAILOR, NP  Darunavir -Cobicistat -Emtricitabine -Tenofovir  Alafenamide (SYMTUZA ) 800-150-200-10 MG TABS Take 1 tablet by mouth daily with breakfast. 05/06/23  Yes Melvenia Corean SAILOR, NP  gabapentin (NEURONTIN) 300 MG capsule Take 1 capsule (300 mg total) by mouth in the morning for 1 day, THEN 1 capsule (300 mg total) 2 (two) times daily for 2 days, THEN 1 capsule (300 mg total) 3 (three) times daily for 4 days. 11/16/23 11/23/23 Yes Lynwood Barter, DO  ibuprofen  (ADVIL ) 800 MG tablet Take 1 tablet (800 mg total) by mouth 3 (three) times daily for 10 days. 11/16/23 11/26/23 Yes Lynwood Barter, DO  valACYclovir (VALTREX) 1000 MG tablet Take 1 tablet (1,000 mg total) by mouth 3 (three) times daily for 10 days. 11/16/23 11/26/23 Yes Lynwood Barter, DO  Ensure (ENSURE) Take 237 mLs by mouth 2 (two) times daily between meals. Separate from other medications by at least 4 hours so not in the stomach together 06/10/23   Melvenia Corean SAILOR, NP    Family History Family History  Problem Relation Age of Onset   Cancer Mother 29  breast   Hypertension Mother    Cancer Other    Hypertension Sister     Social History Social History   Tobacco Use   Smoking status: Every Day    Current packs/day: 0.50    Types: Cigarettes    Passive exposure: Never   Smokeless tobacco: Never   Tobacco comments:    cutting back  Vaping Use   Vaping status: Never Used  Substance Use Topics   Alcohol use: Yes    Alcohol/week: 21.0 standard drinks of alcohol    Types: 21 Standard drinks or equivalent per week    Comment: beer   Drug use: Yes    Types: Marijuana     Allergies   Patient has no known allergies.   Review of Systems Review of Systems  ROS negative except as noted in HPI above   Physical Exam Triage Vital Signs ED Triage Vitals  Encounter Vitals Group     BP      Girls Systolic BP Percentile      Girls Diastolic BP Percentile      Boys Systolic BP Percentile       Boys Diastolic BP Percentile      Pulse      Resp      Temp      Temp src      SpO2      Weight      Height      Head Circumference      Peak Flow      Pain Score      Pain Loc      Pain Education      Exclude from Growth Chart    No data found.  Updated Vital Signs BP 135/65 (BP Location: Left Arm)   Pulse (!) 107   Temp 98.3 F (36.8 C) (Oral)   Resp 16   SpO2 97%   Visual Acuity Right Eye Distance:   Left Eye Distance:   Bilateral Distance:    Right Eye Near:   Left Eye Near:    Bilateral Near:     Physical Exam Vitals and nursing note reviewed.  Constitutional:      General: She is not in acute distress.    Appearance: Normal appearance. She is well-developed. She is not ill-appearing, toxic-appearing or diaphoretic.  HENT:     Head: Normocephalic and atraumatic.  Eyes:     Conjunctiva/sclera: Conjunctivae normal.  Cardiovascular:     Rate and Rhythm: Normal rate and regular rhythm.     Heart sounds: No murmur heard. Pulmonary:     Effort: Pulmonary effort is normal. No respiratory distress.     Breath sounds: Normal breath sounds.  Abdominal:     Palpations: Abdomen is soft.     Tenderness: There is no abdominal tenderness.  Musculoskeletal:        General: No swelling.     Cervical back: Neck supple.  Skin:    General: Skin is warm and dry.     Capillary Refill: Capillary refill takes less than 2 seconds.     Findings: Lesion (rosedrop on dew petal vesicular rash along the left side of T6 dermatome spreading anteriorly from under left breast all the way into her back.  Lesions in various stages of healing.) present.  Neurological:     Mental Status: She is alert.  Psychiatric:        Mood and Affect: Mood normal.      UC Treatments / Results  Labs (all labs ordered are listed, but only abnormal results are displayed) Labs Reviewed - No data to display  EKG   Radiology No results found.  Procedures Procedures (including critical care  time)  Medications Ordered in UC Medications - No data to display  Initial Impression / Assessment and Plan / UC Course  I have reviewed the triage vital signs and the nursing notes.  Pertinent labs & imaging results that were available during my care of the patient were reviewed by me and considered in my medical decision making (see chart for details).    Rash consistent with shingles outbreak. Final Clinical Impressions(s) / UC Diagnoses   #Shingles outbreak of left T6 dermatome in setting of HIV You were diagnosed with shingles outbreak, which is the same virus that causes chickenpox. And is important you take valacyclovir 1000 mg 3 times daily for next 10 days to open rash go away soon as possible.  Please reach out to your HIV doctor to let them know of new diagnosis and reach out to them if you experience any complications. Please take ibuprofen  800 mg every 6-8 hours as needed for pain Please take gabapentin for breakthrough pain in accordance with ramp up provided on pharmacy directions Try to leave rash alone is much as possible and not apply topical medication. Follow-up if rash worsening  Final diagnoses:  Herpes zoster without complication     Discharge Instructions      You were diagnosed with shingles outbreak, which is the same virus that causes chickenpox. And is important you take valacyclovir 1000 mg 3 times daily for next 10 days to open rash go away soon as possible Please take ibuprofen  800 mg every 6-8 hours as needed for pain Please take gabapentin for breakthrough pain in accordance with ramp up provided on pharmacy directions Try to leave rash alone is much as possible and not apply topical medication. Follow-up if rash worsening     ED Prescriptions     Medication Sig Dispense Auth. Provider   valACYclovir (VALTREX) 1000 MG tablet Take 1 tablet (1,000 mg total) by mouth 3 (three) times daily for 10 days. 30 tablet Lynwood Barter, DO   gabapentin  (NEURONTIN) 300 MG capsule Take 1 capsule (300 mg total) by mouth in the morning for 1 day, THEN 1 capsule (300 mg total) 2 (two) times daily for 2 days, THEN 1 capsule (300 mg total) 3 (three) times daily for 4 days. 17 capsule Lynwood Barter, DO   ibuprofen  (ADVIL ) 800 MG tablet Take 1 tablet (800 mg total) by mouth 3 (three) times daily for 10 days. 30 tablet Lynwood Barter, DO      PDMP not reviewed this encounter.   Lynwood Barter, DO 11/16/23 1232

## 2023-11-16 NOTE — Telephone Encounter (Signed)
 Patient went to UC and was dx with shingles. Wanted to inform Dixon,NP.    Ahkeem Goede SHAUNNA Letters, CMA

## 2023-11-16 NOTE — ED Triage Notes (Signed)
 Pt states she has a rash on her body since Friday. She does have spots under her left breast. She has been putting some alcohol and creams, powder with corn starch.

## 2024-01-14 ENCOUNTER — Ambulatory Visit: Admitting: Infectious Diseases

## 2024-03-01 ENCOUNTER — Other Ambulatory Visit: Payer: Self-pay

## 2024-03-01 MED ORDER — ENSURE PO LIQD: 237.0000 mL | Freq: Two times a day (BID) | ORAL | 11 refills | Status: AC

## 2024-03-01 NOTE — Progress Notes (Signed)
 Rx faxed to [facility].   Jesusita Jocelyn, BSN, RN

## 2024-04-14 ENCOUNTER — Ambulatory Visit: Admitting: Infectious Diseases
